# Patient Record
Sex: Female | Born: 1965 | Hispanic: No | Marital: Married | Smoking: Never smoker
Health system: Southern US, Community
[De-identification: ages and names within clinical notes are randomized; demographics above are authoritative.]

## PROBLEM LIST (undated history)

## (undated) DIAGNOSIS — M459 Ankylosing spondylitis of unspecified sites in spine: Secondary | ICD-10-CM

## (undated) DIAGNOSIS — M8580 Other specified disorders of bone density and structure, unspecified site: Secondary | ICD-10-CM

## (undated) DIAGNOSIS — I4891 Unspecified atrial fibrillation: Secondary | ICD-10-CM

## (undated) HISTORY — DX: Unspecified atrial fibrillation: I48.91

## (undated) HISTORY — DX: Other specified disorders of bone density and structure, unspecified site: M85.80

## (undated) HISTORY — PX: ABDOMINAL HYSTERECTOMY: SHX81

## (undated) HISTORY — PX: TOTAL ABDOMINAL HYSTERECTOMY: SHX209

## (undated) HISTORY — DX: Ankylosing spondylitis of unspecified sites in spine: M45.9

---

## 2016-02-25 LAB — HM COLONOSCOPY

## 2021-04-09 ENCOUNTER — Other Ambulatory Visit: Payer: Self-pay

## 2021-04-12 ENCOUNTER — Other Ambulatory Visit: Payer: Self-pay

## 2021-04-12 ENCOUNTER — Ambulatory Visit (INDEPENDENT_AMBULATORY_CARE_PROVIDER_SITE_OTHER): Payer: 59 | Admitting: Family Medicine

## 2021-04-12 ENCOUNTER — Encounter: Payer: Self-pay | Admitting: Family Medicine

## 2021-04-12 VITALS — BP 120/74 | HR 79 | Temp 97.0°F | Ht 61.75 in | Wt 128.4 lb

## 2021-04-12 DIAGNOSIS — Z1231 Encounter for screening mammogram for malignant neoplasm of breast: Secondary | ICD-10-CM

## 2021-04-12 DIAGNOSIS — M459 Ankylosing spondylitis of unspecified sites in spine: Secondary | ICD-10-CM | POA: Insufficient documentation

## 2021-04-12 DIAGNOSIS — M8580 Other specified disorders of bone density and structure, unspecified site: Secondary | ICD-10-CM | POA: Insufficient documentation

## 2021-04-12 DIAGNOSIS — K219 Gastro-esophageal reflux disease without esophagitis: Secondary | ICD-10-CM | POA: Diagnosis not present

## 2021-04-12 DIAGNOSIS — M457 Ankylosing spondylitis of lumbosacral region: Secondary | ICD-10-CM | POA: Diagnosis not present

## 2021-04-12 DIAGNOSIS — L439 Lichen planus, unspecified: Secondary | ICD-10-CM | POA: Diagnosis not present

## 2021-04-12 DIAGNOSIS — M8588 Other specified disorders of bone density and structure, other site: Secondary | ICD-10-CM

## 2021-04-12 DIAGNOSIS — E875 Hyperkalemia: Secondary | ICD-10-CM

## 2021-04-12 DIAGNOSIS — K5909 Other constipation: Secondary | ICD-10-CM

## 2021-04-12 MED ORDER — ESTRADIOL 0.5 MG PO TABS: 0.5000 mg | ORAL_TABLET | Freq: Every day | ORAL | 3 refills | Status: DC

## 2021-04-12 NOTE — Progress Notes (Signed)
[facility] [address] [place] [address] Dept: [phone] Dept Fax: [phone]  New Patient Office Visit  Subjective:    Patient ID: Emily Woodward, female    DOB: 1965-08-19, 56 y.o..   MRN: [id number]  Chief Complaint  Patient presents with   Establish Care    NP-establish care.   No concerns.        History of Present Illness:  Patient is in today to establish care. Emily Woodward was born in [place]. She moved to the [place] ([place] in 1995. They later moved to [facility]. 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 She has been married for 31 years. She has a daughter (46) who is in her residency in [place]. She denies tobacco or drug use. She drinks 2-3 glasses of wine per week.  Emily Woodward has a history of ankylosing spondylitis. She is managing this with yoga, stretching, regular physical activity and meloxicam when she has flares.  She has a strong family history of AS (mother, brother, and sister).  Emily Woodward has a history of lichen planus. She managed this with an oral lidocaine solution for lesions in the mouth.  Emily Woodward has a history of osteopenia. She has had two prior fractures, though these were due to traumas and do not sound osteoporotic. She had her last DXA scan in 2018. It showed T-scores between -1.4 to -2.2. She was treated int he past with alendronate. She then was on estradiol for a period of time. She has now been on risedronate. She notes her daughter has asked her about going back on an estregen, as she does still experience hot flashes and she has had a prior hysterectomy.  Emily Woodward has a history of chronic constipation. She is managed with plecanatide (Trulance). She was tried on other preparations (including fiber, Linzess and Amitiza), but has been able to tolerate the Trulance better.  Emily Woodward has a history of GERD, well managed on Protonix.  Emily Woodward notes a history of mildly  elevated potassium levels. She notes she was previously evaluated, but without an underlying cause having been found. She avoids potassium-containing supplements.  Past Medical History: Patient Active Problem List   Diagnosis Date Noted   Ankylosing spondylitis (HCC) 04/12/2021   Osteopenia 04/12/2021   Lichen planus 04/12/2021   GERD (gastroesophageal reflux disease) 04/12/2021   Hyperkalemia 04/12/2021   Past Surgical History:  Procedure Laterality Date   ABDOMINAL HYSTERECTOMY     Family History  Problem Relation Age of Onset   Arthritis Mother    Hyperlipidemia Mother    Osteoporosis Mother    Ankylosing spondylitis Father    Diabetes Father    Ankylosing spondylitis Sister    Cancer Sister        ovarian   Ankylosing spondylitis Brother    Outpatient Medications Prior to Visit  Medication Sig Dispense Refill   calcium carbonate (OS-CAL - DOSED IN MG OF ELEMENTAL CALCIUM) 1250 (500 Ca) MG tablet Take 1 tablet by mouth.     lidocaine (XYLOCAINE) 2 % solution SMARTSIG:15 Milliliter(s) By Mouth Every 3 Hours PRN     meloxicam (MOBIC) 15 MG tablet Take 15 mg by mouth daily.     Multiple Vitamin (MULTIVITAMIN) tablet Take 1 tablet by mouth daily.     pantoprazole (PROTONIX) 40 MG tablet Take 40 mg by mouth daily.     risedronate (ACTONEL) 150 MG tablet Take 150 mg by mouth every 30 (thirty) days.  TRULANCE 3 MG TABS Take 1 tablet by mouth daily.     No facility-administered medications prior to visit.   No Known Allergies    Objective:   Today's Vitals   04/12/21 1314  BP: 120/74  Pulse: 79  Temp: (!) 97 F (36.1 C)  TempSrc: Temporal  SpO2: 98%  Weight: 128 lb 6.4 oz (58.2 kg)  Height: 5' 1.75" (1.568 m)   Body mass index is 23.68 kg/m.   General: Well developed, well nourished. No acute distress. Psych: Alert and oriented. Normal mood and affect.  Health Maintenance Due  Topic Date Due   HIV Screening  Never done   Hepatitis C Screening  Never done    TETANUS/TDAP  Never done   PAP SMEAR-Modifier  Never done   COLONOSCOPY (Pts 45-28yrs Insurance coverage will need to be confirmed)  Never done   MAMMOGRAM  Never done   Zoster Vaccines- Shingrix (1 of 2) Never done    Assessment & Plan:   1. Ankylosing spondylitis of lumbosacral region (HCC) Stable on meloxicam and with stretches/yoga. No extra-articular effects noted.  2. Osteopenia of lumbar spine I recommend we repeat her DXA scan to see if there is any progressive bone loss. I wills tart her back on estradiol, but will hold off on discontinuing the risedronate until after we see the bone density results.  - DG Bone Density; Future - estradiol (ESTRACE) 0.5 MG tablet; Take 1 tablet (0.5 mg total) by mouth daily.  Dispense: 90 tablet; Refill: 3  3. Lichen planus Stable with PRN topical lidocaine for oral lesions.  4. Gastroesophageal reflux disease without esophagitis Stable on Protonix.  5. Hyperkalemia I reviewed labs from Emily Woodward's previous provider in [place]. We will monitor this for now.  6. Encounter for screening mammogram for malignant neoplasm of breast  - MM DIGITAL SCREENING BILATERAL; Future  Emily Mast, MD

## 2021-04-13 ENCOUNTER — Telehealth: Payer: Self-pay

## 2021-04-13 NOTE — Telephone Encounter (Signed)
PR for Trulance 3 mg tabs submitted through cover my meds to [employer]. Awaiting on response. Dm/cma     Key: [id number]

## 2021-04-13 NOTE — Telephone Encounter (Signed)
PA denied. Dm/cma

## 2021-04-20 ENCOUNTER — Telehealth: Payer: Self-pay

## 2021-04-20 NOTE — Telephone Encounter (Signed)
PA submitted to [employer] through cover my meds for the Trulance 3 mg tabs.  Awaiting response.   Dm/cma  Key: [id number]

## 2021-04-21 NOTE — Telephone Encounter (Signed)
PA denied again due to not a covered benefit and excluded from coverage in accordance with the terms and conditions of you plan benefit. Dm/cma

## 2021-05-06 ENCOUNTER — Telehealth: Payer: Self-pay | Admitting: Family Medicine

## 2021-05-06 NOTE — Telephone Encounter (Signed)
Pt called because she received a bill for over $300 from her new pt appt with Dr Gena Fray and she thought maybe her insurance wasn't billed but I see it shows on the appt. I gave her the billing number so maybe they can address but just in case she calls back I wanted to go ahead and make you aware ?

## 2021-05-10 NOTE — Telephone Encounter (Signed)
Pt called billing and charges were explained on 05/07/21 ?

## 2021-05-11 ENCOUNTER — Ambulatory Visit
Admission: RE | Admit: 2021-05-11 | Discharge: 2021-05-11 | Disposition: A | Discharge status: Home / Self Care | Payer: 59 | Source: Ambulatory Visit | Attending: Family Medicine | Admitting: Family Medicine

## 2021-05-11 DIAGNOSIS — Z1231 Encounter for screening mammogram for malignant neoplasm of breast: Secondary | ICD-10-CM

## 2021-07-28 ENCOUNTER — Ambulatory Visit (INDEPENDENT_AMBULATORY_CARE_PROVIDER_SITE_OTHER): Payer: 59 | Admitting: Family Medicine

## 2021-07-28 VITALS — BP 118/66 | HR 71 | Temp 97.8°F | Ht 61.7 in | Wt 129.8 lb

## 2021-07-28 DIAGNOSIS — M6530 Trigger finger, unspecified finger: Secondary | ICD-10-CM | POA: Insufficient documentation

## 2021-07-28 DIAGNOSIS — N3001 Acute cystitis with hematuria: Secondary | ICD-10-CM

## 2021-07-28 DIAGNOSIS — S62009A Unspecified fracture of navicular [scaphoid] bone of unspecified wrist, initial encounter for closed fracture: Secondary | ICD-10-CM | POA: Insufficient documentation

## 2021-07-28 LAB — POCT URINALYSIS DIPSTICK
Bilirubin, UA: NEGATIVE
Glucose, UA: NEGATIVE
Ketones, UA: NEGATIVE
Nitrite, UA: NEGATIVE
Protein, UA: NEGATIVE
Spec Grav, UA: 1.01 (ref 1.010–1.025)
Urobilinogen, UA: 0.2 E.U./dL
pH, UA: 6 (ref 5.0–8.0)

## 2021-07-28 MED ORDER — PHENAZOPYRIDINE HCL 100 MG PO TABS: 100.0000 mg | ORAL_TABLET | Freq: Three times a day (TID) | ORAL | 0 refills | Status: DC | PRN

## 2021-07-28 MED ORDER — SULFAMETHOXAZOLE-TRIMETHOPRIM 800-160 MG PO TABS: 1.0000 | ORAL_TABLET | Freq: Two times a day (BID) | ORAL | 0 refills | Status: DC

## 2021-07-28 NOTE — Progress Notes (Signed)
[facility] [address] [place] [address] Dept: [phone] Dept Fax: [phone]  Office Visit  Subjective:    Patient ID: Emily Woodward, female    DOB: 1965-12-26, 56 y.o..   MRN: [id number]  Chief Complaint  Patient presents with   Acute Visit    C/o having urine frequency/burning x 1 month.      History of Present Illness:  Patient is in today for assessment of a possibel UTI. Emily Woodward notes she has had some symptoms over the past month. She had traveled back to [place]. While there she was treated with a 5-day course of Cipro. She had some residual symptoms after the antibiotics that she thought might be a yeast infection, so she took a 3-day course of Diflucan. She is now noting ongoing nocturia, frequency, and dysuria.  Past Medical History: Patient Active Problem List   Diagnosis Date Noted   Fracture of scaphoid bone of wrist 07/28/2021   Acquired trigger finger 07/28/2021   Ankylosing spondylitis ([place]) 04/12/2021   Osteopenia 0000000   Lichen planus 0000000   GERD (gastroesophageal reflux disease) 04/12/2021   Hyperkalemia 04/12/2021   Chronic constipation 04/12/2021   Past Surgical History:  Procedure Laterality Date   ABDOMINAL HYSTERECTOMY     Family History  Problem Relation Age of Onset   Arthritis Mother    Hyperlipidemia Mother    Osteoporosis Mother    Ankylosing spondylitis Father    Diabetes Father    Ankylosing spondylitis Sister    Cancer Sister        ovarian   Ankylosing spondylitis Brother    Outpatient Medications Prior to Visit  Medication Sig Dispense Refill   calcium carbonate (OS-CAL - DOSED IN MG OF ELEMENTAL CALCIUM) 1250 (500 Ca) MG tablet Take 1 tablet by mouth.     estradiol (ESTRACE) 0.5 MG tablet Take 1 tablet (0.5 mg total) by mouth daily. 90 tablet 3   lidocaine (XYLOCAINE) 2 % solution SMARTSIG:15 Milliliter(s) By Mouth Every 3 Hours PRN     meloxicam  (MOBIC) 15 MG tablet Take 15 mg by mouth daily.     Multiple Vitamin (MULTIVITAMIN) tablet Take 1 tablet by mouth daily.     pantoprazole (PROTONIX) 40 MG tablet Take 40 mg by mouth daily.     polyethylene glycol (MIRALAX / GLYCOLAX) 17 g packet Take 17 g by mouth daily.     risedronate (ACTONEL) 150 MG tablet Take 150 mg by mouth every 30 (thirty) days.     TRULANCE 3 MG TABS Take 1 tablet by mouth daily. (Patient not taking: Reported on 07/28/2021)     No facility-administered medications prior to visit.   No Known Allergies    Objective:   Today's Vitals   07/28/21 0809  BP: 118/66  Pulse: 71  Temp: 97.8 F (36.6 C)  TempSrc: Temporal  SpO2: 99%  Weight: 129 lb 12.8 oz (58.9 kg)  Height: 5' 1.7" (1.567 m)   Body mass index is 23.97 kg/m.   General: Well developed, well nourished. No acute distress. Psych: Alert and oriented. Normal mood and affect.  Health Maintenance Due  Topic Date Due   HIV Screening  Never done   Hepatitis C Screening  Never done   Lab Results Urine dipstick shows negative for nitrites, glucose, ketones, urobilinogen, positive for leukocytes, red blood cells, protein.  Assessment & Plan:   1. Acute cystitis with hematuria It appears Emily Woodward continues to have a UTI. I  will treat her empirically with Septra. I will send the urine for culture and sensitivity. I will provide Pyridium for discomfort.  - POCT Urinalysis Dipstick - Urine Culture - sulfamethoxazole-trimethoprim (BACTRIM DS) 800-160 MG tablet; Take 1 tablet by mouth 2 (two) times daily.  Dispense: 14 tablet; Refill: 0 - phenazopyridine (PYRIDIUM) 100 MG tablet; Take 1 tablet (100 mg total) by mouth 3 (three) times daily as needed for pain.  Dispense: 6 tablet; Refill: 0   Return if symptoms worsen or fail to improve.   Haydee Salter, MD

## 2021-07-29 ENCOUNTER — Other Ambulatory Visit: Payer: Self-pay

## 2021-07-29 NOTE — Telephone Encounter (Signed)
Refill request for  Risendronate Sodium 15 mg LR   hx provider LOV 07/28/21 FOV  04/14/22  Please review and advise.  Thanks.  Dm/cma

## 2021-07-30 MED ORDER — RISEDRONATE SODIUM 150 MG PO TABS: 150.0000 mg | ORAL_TABLET | ORAL | 3 refills | Status: DC

## 2021-07-31 LAB — URINE CULTURE
MICRO NUMBER:: 13445363
SPECIMEN QUALITY:: ADEQUATE

## 2021-08-23 ENCOUNTER — Ambulatory Visit: Payer: 59 | Admitting: Family Medicine

## 2021-08-23 VITALS — BP 122/70 | HR 71 | Temp 97.6°F | Ht 61.5 in | Wt 129.5 lb

## 2021-08-23 DIAGNOSIS — R300 Dysuria: Secondary | ICD-10-CM | POA: Diagnosis not present

## 2021-08-23 DIAGNOSIS — Z8619 Personal history of other infectious and parasitic diseases: Secondary | ICD-10-CM | POA: Diagnosis not present

## 2021-08-23 LAB — POCT URINALYSIS DIPSTICK
Bilirubin, UA: NEGATIVE
Blood, UA: NEGATIVE
Glucose, UA: NEGATIVE
Ketones, UA: NEGATIVE
Leukocytes, UA: NEGATIVE
Nitrite, UA: NEGATIVE
Protein, UA: NEGATIVE
Spec Grav, UA: 1.015 (ref 1.010–1.025)
Urobilinogen, UA: 0.2 E.U./dL
pH, UA: 6 (ref 5.0–8.0)

## 2021-08-23 NOTE — Progress Notes (Signed)
[facility] [address] [place] [address] Dept: [phone] Dept Fax: [phone]  Office Visit  Subjective:    Patient ID: Emily Woodward, female    DOB: [birth year], 56 y.o..   MRN: [id number]  Chief Complaint  Patient presents with   Acute Visit    Co having dysuria x 4 days.      History of Present Illness:  Patient is in today with a 4-day history of dysuria. I saw Emily Woodward 3 weeks ago, noting she had UTI symptoms over the prior month. She had traveled back to [place]. While there, she was treated with a 5-day course of Cipro. She had some residual symptoms after the antibiotics that she thought might be a yeast infection, so she took a 3-day course of Diflucan. She then had ongoing nocturia, frequency, and dysuria. I treated her with a 7-day course of Septra. She notes that her symptoms did improve and resolve, through it was a few days after she stopped the antibiotic before they fully resolved. She is now having a return of dysuria. She is postmenopausal. She is currently on estradiol. She denies use of any feminine hygiene products. She is now taking a daily cranberry extract capsule.  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 Escherichia coli 08/23/2021   Fracture of scaphoid bone of wrist 07/28/2021   Acquired trigger finger 07/28/2021   Ankylosing spondylitis (HCC) 04/12/2021   Osteopenia 04/12/2021   Lichen planus 04/12/2021   GERD (gastroesophageal reflux disease) 04/12/2021   Hyperkalemia 04/12/2021   Chronic constipation 04/12/2021   Past Surgical History:  Procedure Laterality Date   ABDOMINAL HYSTERECTOMY     Family History  Problem Relation Age of Onset   Arthritis Mother    Hyperlipidemia Mother    Osteoporosis Mother    Ankylosing spondylitis Father    Diabetes Father    Ankylosing spondylitis Sister    Cancer Sister         ovarian   Ankylosing spondylitis Brother    Outpatient Medications Prior to Visit  Medication Sig Dispense Refill   calcium carbonate (OS-CAL - DOSED IN MG OF ELEMENTAL CALCIUM) 1250 (500 Ca) MG tablet Take 1 tablet by mouth.     estradiol (ESTRACE) 0.5 MG tablet Take 1 tablet (0.5 mg total) by mouth daily. 90 tablet 3   lidocaine (XYLOCAINE) 2 % solution SMARTSIG:15 Milliliter(s) By Mouth Every 3 Hours PRN     meloxicam (MOBIC) 15 MG tablet Take 15 mg by mouth daily.     Multiple Vitamin (MULTIVITAMIN) tablet Take 1 tablet by mouth daily.     pantoprazole (PROTONIX) 40 MG tablet Take 40 mg by mouth daily.     polyethylene glycol (MIRALAX / GLYCOLAX) 17 g packet Take 17 g by mouth daily.     risedronate (ACTONEL) 150 MG tablet Take 1 tablet (150 mg total) by mouth every 30 (thirty) days. 3 tablet 3   TRULANCE 3 MG TABS Take 1 tablet by mouth daily.     phenazopyridine (PYRIDIUM) 100 MG tablet Take 1 tablet (100 mg total) by mouth 3 (three) times daily as needed for pain. 6 tablet 0   sulfamethoxazole-trimethoprim (BACTRIM DS) 800-160 MG tablet Take 1 tablet by mouth 2 (two) times daily. 14 tablet 0   No facility-administered medications prior to visit.   No Known Allergies    Objective:   Today's Vitals   08/23/21  1024  BP: 122/70  Pulse: 71  Temp: 97.6 F (36.4 C)  TempSrc: Temporal  SpO2: 98%  Weight: 129 lb 8 oz (58.7 kg)  Height: 5' 1.5" (1.562 m)   Body mass index is 24.07 kg/m.   General: Well developed, well nourished. No acute distress. Psych: Alert and oriented. Normal mood and affect.  Health Maintenance Due  Topic Date Due   HIV Screening  Never done   Hepatitis C Screening  Never done    Lab Results Component 3 wk ago  MICRO NUMBER: [id number]   SPECIMEN QUALITY: Adequate   Sample Source NOT GIVEN   STATUS: FINAL   ISOLATE 1: ESBL Escherichia coli Abnormal    Comment: 10,000-49,000 CFU/mL of Escherichia coli (ESBL) ESBL RESULT:        The organism has  been confirmed as an ESBL producer.  Resulting Agency [facility] [place]     Susceptibility   Esbl escherichia coli    URINE CULTURE, REFLEX    AMOX/CLAVULANIC >=32  Resistant    AMPICILLIN >=32  Resistant 1    AMPICILLIN/SULBACTAM >=32  Resistant    CEFAZOLIN >=64  Resistant 2    CEFEPIME 2  Sensitive    CEFTAZIDIME 16  Resistant    CEFTRIAXONE >=64  Resistant    CIPROFLOXACIN >=4  Resistant    GENTAMICIN >=16  Resistant    IMIPENEM <=0.25  Sensitive    LEVOFLOXACIN >=8  Resistant    NITROFURANTOIN <=16  Sensitive    PIP/TAZO 8  Sensitive    TOBRAMYCIN >=16  Resistant    TRIMETH/SULFA <=20  Sensitive 3           1 Extended spectrum beta-lactamase (ESBL) producing  organisms demonstrate decreased activity with  penicillins, cephalosporins and aztreonam.   2 For uncomplicated UTI caused by E. coli,  K. pneumoniae or P. mirabilis: Cefazolin is  susceptible if MIC <32 mcg/mL and predicts  susceptible to the oral agents cefaclor, cefdinir,  cefpodoxime, cefprozil, cefuroxime, cephalexin  and loracarbef.           Component Ref Range & Units 10:29 3 wk ago  Color, UA  light yellow  yellow   Clarity, UA  clear  cloudy   Glucose, UA Negative Negative  Negative   Bilirubin, UA  neg  neg   Ketones, UA  neg  neg   Spec Grav, UA 1.010 - 1.025 1.015  1.010   Blood, UA  neg  1+   pH, UA 5.0 - 8.0 6.0  6.0   Protein, UA Negative Negative  Negative CM   Urobilinogen, UA 0.2 or 1.0 E.U./dL 0.2  0.2   Nitrite, UA  neg  neg   Leukocytes, UA Negative Negative  Large (3+) Abnormal        Assessment & Plan:   1. Dysuria Emily Woodward's urine dipstick results today are clear. We did discuss other possible causes of dysuria other than infection. In light of her recent ESBL E. coli infection, I will go ahead and check a urine culture to be sure this did clear.  - POCT Urinalysis Dipstick - Urine Culture - Urinalysis w microscopic + reflex cultur  2. History of infection  due to ESBL Escherichia coli As above.  - Urine Culture - Urinalysis w microscopic + reflex cultur   Return if symptoms worsen or fail to improve.   Loyola Mast, MD

## 2021-08-24 LAB — URINALYSIS, COMPLETE
Bacteria, UA: NONE SEEN /HPF
Bilirubin Urine: NEGATIVE
Glucose, UA: NEGATIVE
Hgb urine dipstick: NEGATIVE
Hyaline Cast: NONE SEEN /LPF
Ketones, ur: NEGATIVE
Leukocytes,Ua: NEGATIVE
Nitrite: NEGATIVE
Protein, ur: NEGATIVE
RBC / HPF: NONE SEEN /HPF (ref 0–2)
Specific Gravity, Urine: 1.006 (ref 1.001–1.035)
Squamous Epithelial / HPF: NONE SEEN /HPF (ref <=5)
WBC, UA: NONE SEEN /HPF (ref 0–5)
pH: 5.5 (ref 5.0–8.0)

## 2021-08-24 LAB — URINE CULTURE
MICRO NUMBER:: 13542514
Result:: NO GROWTH
SPECIMEN QUALITY:: ADEQUATE

## 2021-09-29 ENCOUNTER — Ambulatory Visit
Admission: RE | Admit: 2021-09-29 | Discharge: 2021-09-29 | Disposition: A | Discharge status: Home / Self Care | Payer: 59 | Source: Ambulatory Visit | Attending: Family Medicine | Admitting: Family Medicine

## 2021-09-29 DIAGNOSIS — M8588 Other specified disorders of bone density and structure, other site: Secondary | ICD-10-CM

## 2021-11-16 ENCOUNTER — Ambulatory Visit (INDEPENDENT_AMBULATORY_CARE_PROVIDER_SITE_OTHER): Payer: 59 | Admitting: Family Medicine

## 2021-11-16 ENCOUNTER — Encounter: Payer: Self-pay | Admitting: Family Medicine

## 2021-11-16 VITALS — BP 116/78 | HR 66 | Temp 97.5°F | Ht 61.5 in | Wt 129.0 lb

## 2021-11-16 DIAGNOSIS — M8589 Other specified disorders of bone density and structure, multiple sites: Secondary | ICD-10-CM

## 2021-11-16 DIAGNOSIS — N3090 Cystitis, unspecified without hematuria: Secondary | ICD-10-CM | POA: Diagnosis not present

## 2021-11-16 NOTE — Progress Notes (Unsigned)
[facility] [address] [place] [address] Dept: [phone] Dept Fax: [phone]  Office Visit  Subjective:    Patient ID: Emily Woodward, female    DOB: Sep 11, 1965, 56 y.o..   MRN: [id number]  Chief Complaint  Patient presents with   Acute Visit    C/o having burning and frequency with urination x 2-3 days.      History of Present Illness:  Patient is in today with a three day history of dysuria and frequency. Emily Woodward has had bladder infections twice since May. One involved an ESBL E. coli, but this was contracted after a trip to [place]. She had been taking a cranberry extract, but is no longer doing this. However, Emily Woodward has a concern about her recurrent infections.  Emily Woodward has a history of osteopenia and a wrist bone fracture. She was started on risendronate about 2-3 years ago. She had a recent Dexa scan.  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 Escherichia coli 08/23/2021   Fracture of scaphoid bone of wrist 07/28/2021   Acquired trigger finger 07/28/2021   Ankylosing spondylitis (HCC) 04/12/2021   Osteopenia 04/12/2021   Lichen planus 04/12/2021   GERD (gastroesophageal reflux disease) 04/12/2021   Hyperkalemia 04/12/2021   Chronic constipation 04/12/2021   Past Surgical History:  Procedure Laterality Date   ABDOMINAL HYSTERECTOMY     Family History  Problem Relation Age of Onset   Arthritis Mother    Hyperlipidemia Mother    Osteoporosis Mother    Ankylosing spondylitis Father    Diabetes Father    Ankylosing spondylitis Sister    Cancer Sister        ovarian   Ankylosing spondylitis Brother    Outpatient Medications Prior to Visit  Medication Sig Dispense Refill   calcium carbonate (OS-CAL - DOSED IN MG OF ELEMENTAL CALCIUM) 1250 (500 Ca) MG tablet Take 1 tablet by mouth.     estradiol (ESTRACE) 0.5 MG tablet Take 1  tablet (0.5 mg total) by mouth daily. 90 tablet 3   lidocaine (XYLOCAINE) 2 % solution SMARTSIG:15 Milliliter(s) By Mouth Every 3 Hours PRN     meloxicam (MOBIC) 15 MG tablet Take 15 mg by mouth daily.     Multiple Vitamin (MULTIVITAMIN) tablet Take 1 tablet by mouth daily.     pantoprazole (PROTONIX) 40 MG tablet Take 40 mg by mouth daily.     polyethylene glycol (MIRALAX / GLYCOLAX) 17 g packet Take 17 g by mouth daily.     risedronate (ACTONEL) 150 MG tablet Take 1 tablet (150 mg total) by mouth every 30 (thirty) days. 3 tablet 3   No facility-administered medications prior to visit.   No Known Allergies    Objective:   Today's Vitals   11/16/21 1602  BP: 116/78  Pulse: 66  Temp: (!) 97.5 F (36.4 C)  TempSrc: Temporal  SpO2: 99%  Weight: 129 lb (58.5 kg)  Height: 5' 1.5" (1.562 m)   Body mass index is 23.98 kg/m.   General: Well developed, well nourished. No acute distress. Psych: Alert and oriented. Normal mood and affect.  Health Maintenance Due  Topic Date Due   HIV Screening  Never done   Hepatitis C Screening  Never done   Lab Results Urine dipstick shows negative for nitrites, red blood cells, glucose, protein, ketones, urobilinogen, positive for leukocytes.    Imaging: Bone Density (09/29/2021) ASSESSMENT:  The BMD measured at Femur Neck Left is 0.759 g/cm2 with a T-score of -2.0. This patient is considered osteopenic/low bone mass according to World Health Organization [facility]) criteria.   The quality of the exam is good. The patient does not meet the FRAX criteria.   Site Region Measured Date Measured Age YA BMD Significant CHANGE T-score DualFemur Neck Left 09/29/2021 56.1 -2.0 0.759 g/cm2   AP Spine L1-L4 09/29/2021 56.1 -1.6 0.984 g/cm2   DualFemur Total Mean 09/29/2021 56.1 -1.4 0.834 g/cm2   Left Forearm Radius 33% 09/29/2021 56.1 -0.6 0.825 g/cm2  Assessment & Plan:   1. Recurrent cystitis The urine shows 3+ leukocytes. I offered empiric  antibiotics, but she prefers to await the result of her urine culture, in light of her past resistant infeciton. I recommended she push fluids int he meantime. In light of her having three UTIs in the past 4 months, I will refer her to urology for an assessment.  - POCT Urinalysis Dipstick - Urine Culture - Ambulatory referral to Urology  2. Osteopenia of multiple sites The bone density is consistent with osteopenia of the lumbar spine and femur. As she was previously started on risedronate, I will continue this for now. I recommend we treat her for 5 years and then stop this medicine.  Return if symptoms worsen or fail to improve.   Loyola Mast, MD

## 2021-11-17 LAB — POCT URINALYSIS DIPSTICK
Bilirubin, UA: NEGATIVE
Blood, UA: NEGATIVE
Glucose, UA: NEGATIVE
Ketones, UA: NEGATIVE
Nitrite, UA: NEGATIVE
Protein, UA: NEGATIVE
Spec Grav, UA: 1.01 (ref 1.010–1.025)
Urobilinogen, UA: 0.2 E.U./dL
pH, UA: 6 (ref 5.0–8.0)

## 2021-11-19 ENCOUNTER — Other Ambulatory Visit: Payer: Self-pay | Admitting: Family Medicine

## 2021-11-19 DIAGNOSIS — N390 Urinary tract infection, site not specified: Secondary | ICD-10-CM

## 2021-11-19 LAB — URINE CULTURE
MICRO NUMBER:: 13905611
SPECIMEN QUALITY:: ADEQUATE

## 2021-11-19 MED ORDER — NITROFURANTOIN MONOHYD MACRO 100 MG PO CAPS
100.0000 mg | ORAL_CAPSULE | Freq: Two times a day (BID) | ORAL | 0 refills | Status: AC
Start: 2021-11-19 — End: 2021-11-26

## 2021-11-22 ENCOUNTER — Encounter: Payer: Self-pay | Admitting: Family Medicine

## 2022-02-24 ENCOUNTER — Telehealth: Payer: Self-pay | Admitting: Family Medicine

## 2022-02-24 NOTE — Telephone Encounter (Signed)
Pt called today @ 11:45 returning your call. Please pt another call

## 2022-02-24 NOTE — Telephone Encounter (Signed)
Pt stated she would like to get A1C and fasting . Pt stated her sugar was up. What should she do

## 2022-02-24 NOTE — Telephone Encounter (Signed)
Spoke to patient and she states that her fasting sugar has been around 120 and a non-fasting (2 hrs) 200.  She had eaten some rice.  She has an appointment in February, does she need an appointment before then?  Please and thank you.  Dm/cma

## 2022-02-24 NOTE — Telephone Encounter (Signed)
Left VM to rtn call. Dm/cma       

## 2022-03-01 NOTE — Telephone Encounter (Signed)
Spoke to patient, she is scheduled for 03/09/22 @ 8:20 am. Dm/cma

## 2022-03-09 ENCOUNTER — Ambulatory Visit (INDEPENDENT_AMBULATORY_CARE_PROVIDER_SITE_OTHER): Payer: 59 | Admitting: Family Medicine

## 2022-03-09 ENCOUNTER — Encounter: Payer: Self-pay | Admitting: Family Medicine

## 2022-03-09 VITALS — BP 116/74 | HR 71 | Temp 97.2°F | Ht 61.5 in | Wt 125.4 lb

## 2022-03-09 DIAGNOSIS — Z8639 Personal history of other endocrine, nutritional and metabolic disease: Secondary | ICD-10-CM

## 2022-03-09 DIAGNOSIS — R739 Hyperglycemia, unspecified: Secondary | ICD-10-CM | POA: Diagnosis not present

## 2022-03-09 DIAGNOSIS — K219 Gastro-esophageal reflux disease without esophagitis: Secondary | ICD-10-CM

## 2022-03-09 DIAGNOSIS — E875 Hyperkalemia: Secondary | ICD-10-CM | POA: Diagnosis not present

## 2022-03-09 DIAGNOSIS — R0609 Other forms of dyspnea: Secondary | ICD-10-CM

## 2022-03-09 LAB — BASIC METABOLIC PANEL
BUN: 11 mg/dL (ref 6–23)
CO2: 27 mEq/L (ref 19–32)
Calcium: 9.3 mg/dL (ref 8.4–10.5)
Chloride: 103 mEq/L (ref 96–112)
Creatinine, Ser: 0.6 mg/dL (ref 0.40–1.20)
GFR: 100.2 mL/min (ref >60.00)
Glucose, Bld: 100 mg/dL — ABNORMAL HIGH (ref 70–99)
Potassium: 5.5 mEq/L — ABNORMAL HIGH (ref 3.5–5.1)
Sodium: 138 mEq/L (ref 135–145)

## 2022-03-09 LAB — VITAMIN D 25 HYDROXY (VIT D DEFICIENCY, FRACTURES): VITD: 57.49 ng/mL (ref 30.00–100.00)

## 2022-03-09 LAB — HEMOGLOBIN A1C: Hgb A1c MFr Bld: 5.8 % (ref 4.6–6.5)

## 2022-03-09 NOTE — Progress Notes (Signed)
[facility] [address] [place] [address] Dept: [phone] Dept Fax: [phone]  Office Visit  Subjective:    Patient ID: Drake Leach, female    DOB: Jun 30, 1965, 57 y.o..   MRN: [id number]  Chief Complaint  Patient presents with   Follow-up    F/u blood sugar being elevated- fasting 120-200 at home.      History of Present Illness:  Patient is in today for several issues.  Ms. Buege notes she has been monitoring her blood sugars at home. She has a strong family history of diabetes, so feels it important that she screen for this. She has recently seen a fasting blood sugar > 120. She notes she also had a recent occasion where her blood sugar was 200 after she ate rice.  Ms. Gagliano has a history of mildly elevated potassium levels. She notes she was previously evaluated, but without an underlying cause having been found. She avoids potassium-containing supplements. She would like to have this assessed.  Ms. Shearon reports a past history of Vit D deficiency/insufficiency. She takes a daily supplement. She requests a follow-up on this.  Ms. Gombos has a past history of GERD. She notes that she is having episodic issues with a burning sensation in her epigastrium. She can also feel some bloating. She has not been able to determine any specific trigger, except possibly garlic at times. This used to occur about every 6 months. More recently, this has been occurring about every 2 months. She has tried using Protonix, Pepcid Ac, and various antacids without much relief in the short-term.  Ms. Vogt notes she climbed [place] in Oct. She states that on multiple occasions, she found her heart rate to be very rapid and that she felt dyspneic. She feels she had less tolerance for the climbing than others in her group. She describes some occasion episodes at other times that she exerts herself. She asks about having an EKG to  check on her heart.  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 Escherichia coli 08/23/2021   Fracture of scaphoid bone of wrist 07/28/2021   Acquired trigger finger 07/28/2021   Ankylosing spondylitis ([place]) 04/12/2021   Osteopenia 33/00/7622   Lichen planus 63/33/5456   GERD (gastroesophageal reflux disease) 04/12/2021   Hyperkalemia 04/12/2021   Chronic constipation 04/12/2021   Past Surgical History:  Procedure Laterality Date   ABDOMINAL HYSTERECTOMY     Family History  Problem Relation Age of Onset   Arthritis Mother    Hyperlipidemia Mother    Osteoporosis Mother    Ankylosing spondylitis Father    Diabetes Father    Ankylosing spondylitis Sister    Cancer Sister        ovarian   Ankylosing spondylitis Brother    Outpatient Medications Prior to Visit  Medication Sig Dispense Refill   calcium carbonate (OS-CAL - DOSED IN MG OF ELEMENTAL CALCIUM) 1250 (500 Ca) MG tablet Take 1 tablet by mouth.     estradiol (ESTRACE) 0.5 MG tablet Take 1 tablet (0.5 mg total) by mouth daily. 90 tablet 3   lidocaine (XYLOCAINE) 2 % solution SMARTSIG:15 Milliliter(s) By Mouth Every 3 Hours PRN     meloxicam (MOBIC) 15 MG tablet Take 15 mg by mouth daily.     Multiple Vitamin (MULTIVITAMIN) tablet Take 1 tablet by mouth daily.     pantoprazole (PROTONIX) 40 MG tablet Take 40  mg by mouth daily.     polyethylene glycol (MIRALAX / GLYCOLAX) 17 g packet Take 17 g by mouth daily.     risedronate (ACTONEL) 150 MG tablet Take 1 tablet (150 mg total) by mouth every 30 (thirty) days. 3 tablet 3   No facility-administered medications prior to visit.   No Known Allergies    Objective:   Today's Vitals   03/09/22 0841  BP: 116/74  Pulse: 71  Temp: (!) 97.2 F (36.2 C)  TempSrc: Temporal  SpO2: 95%  Weight: 125 lb 6.4 oz (56.9 kg)  Height: 5' 1.5" (1.562 m)   Body mass index is 23.31 kg/m.   General: Well developed,  well nourished. No acute distress. Psych: Alert and oriented. Normal mood and affect.  Health Maintenance Due  Topic Date Due   HIV Screening  Never done   Hepatitis C Screening  Never done     Assessment & Plan:   1. Hyperglycemia In light of the hyperglycemia on home testing, I will check a fasting glucose and A1c today.  - Basic metabolic panel - Hemoglobin A1c  2. Hyperkalemia We will reassess her potassium level today to see if this remains high.  3. History of vitamin D deficiency I will reassess her Vit D level.  - VITAMIN D 25 Hydroxy (Vit-D Deficiency, Fractures)  4. Gastroesophageal reflux disease without esophagitis We reviewed approaches to episodic GERD symptoms. I do not think she necessarily needs to take daily Protonix. She should consider using a combination of an antacid for more immediate relief and a Pepcid AC  twice a day. If this is not managing her symptoms, or the recur more frequently, we can discuss a GI referral for EGD.  5. Dyspnea on exertion It would not be uncommon for someone climbing  [place] (19,341 ft) to experience tachycardia and dyspnea. There is also the possibility that she could have experienced some altitude sickness at that elevation. I do not feel an EKG would be indicated. We can evaluate her complaint further at her upcoming annual physical.  Return for As scheduled.   Haydee Salter, MD

## 2022-04-05 ENCOUNTER — Ambulatory Visit: Payer: 59 | Admitting: Obstetrics and Gynecology

## 2022-04-14 ENCOUNTER — Encounter: Payer: 59 | Admitting: Family Medicine

## 2022-04-15 ENCOUNTER — Ambulatory Visit (INDEPENDENT_AMBULATORY_CARE_PROVIDER_SITE_OTHER): Payer: 59 | Admitting: Family Medicine

## 2022-04-15 ENCOUNTER — Encounter: Payer: Self-pay | Admitting: Family Medicine

## 2022-04-15 VITALS — BP 114/66 | HR 87 | Temp 97.6°F | Ht 61.5 in | Wt 126.8 lb

## 2022-04-15 DIAGNOSIS — Z0000 Encounter for general adult medical examination without abnormal findings: Secondary | ICD-10-CM

## 2022-04-15 DIAGNOSIS — R0609 Other forms of dyspnea: Secondary | ICD-10-CM | POA: Diagnosis not present

## 2022-04-15 DIAGNOSIS — M25811 Other specified joint disorders, right shoulder: Secondary | ICD-10-CM

## 2022-04-15 DIAGNOSIS — E875 Hyperkalemia: Secondary | ICD-10-CM

## 2022-04-15 DIAGNOSIS — Z1159 Encounter for screening for other viral diseases: Secondary | ICD-10-CM

## 2022-04-15 DIAGNOSIS — Z0001 Encounter for general adult medical examination with abnormal findings: Secondary | ICD-10-CM | POA: Diagnosis not present

## 2022-04-15 DIAGNOSIS — Z13220 Encounter for screening for lipoid disorders: Secondary | ICD-10-CM

## 2022-04-15 DIAGNOSIS — K219 Gastro-esophageal reflux disease without esophagitis: Secondary | ICD-10-CM

## 2022-04-15 DIAGNOSIS — M170 Bilateral primary osteoarthritis of knee: Secondary | ICD-10-CM

## 2022-04-15 DIAGNOSIS — M8588 Other specified disorders of bone density and structure, other site: Secondary | ICD-10-CM

## 2022-04-15 DIAGNOSIS — Z8669 Personal history of other diseases of the nervous system and sense organs: Secondary | ICD-10-CM | POA: Insufficient documentation

## 2022-04-15 MED ORDER — ESTRADIOL 0.5 MG PO TABS: 0.5000 mg | ORAL_TABLET | Freq: Every day | ORAL | 3 refills | Status: DC

## 2022-04-15 MED ORDER — RISEDRONATE SODIUM 150 MG PO TABS: 150.0000 mg | ORAL_TABLET | ORAL | 1 refills | Status: DC

## 2022-04-15 NOTE — Assessment & Plan Note (Signed)
Appears to have some chronic anterior knee pain/chondromalacia patella. I will have orthopedics evaluate this.

## 2022-04-15 NOTE — Assessment & Plan Note (Signed)
I feel a climb on [place]. [place] to have been an effective "stress test". However, as she has ongoign worries, I will refer her to cardiology for an assessment.

## 2022-04-15 NOTE — Assessment & Plan Note (Signed)
Continue on Actonel 150 mg monthly.

## 2022-04-15 NOTE — Assessment & Plan Note (Signed)
I will refer her to orthopedics for an assessment. The restricted motion may represent adhesive capsulitis, as she had this in the other shoulder.

## 2022-04-15 NOTE — Assessment & Plan Note (Signed)
I will recheck the potassium to confirm it is chronically elevated.

## 2022-04-15 NOTE — Progress Notes (Signed)
[facility] [address] [place] [address] Dept: [phone] Dept Fax: [phone]  Annual Physical Visit  Subjective:    Patient ID: Emily Woodward, female    DOB: 01-12-1966, 57 y.o..   MRN: [id number]  Chief Complaint  Patient presents with   Annual Exam    CPE/labs. No fasting today.  C/o heart issues    History of Present Illness:  Patient is in today for an annual physical/preventative visit.  Emily Woodward has a long-standing history of hyperkalemia. She has been previously worked up by a nephrologist. She was advised to avoid high potassium foods.  Review of Systems  Constitutional:  Negative for chills, diaphoresis, fever, malaise/fatigue and weight loss.  HENT:  Negative for congestion, ear pain, hearing loss, sore throat and tinnitus.   Eyes:  Negative for blurred vision, pain, discharge and redness.  Respiratory:  Positive for shortness of breath. Negative for cough, wheezing and stridor.        We had previously discussed issues with DOE. Emily Woodward climbed [place] in Oct. On multiple occasions, she found her heart rate to be very rapid and that she felt dyspneic. She felt she had less tolerance for the climbing than others in her group. She has had occasional episodes at other times that she exerts herself, esp. when walking on slopes.  Cardiovascular:  Negative for chest pain and palpitations.  Gastrointestinal:  Positive for heartburn and nausea. Negative for abdominal pain, constipation, diarrhea and vomiting.       Emily Woodward has a past history of GERD. She notes that she has episodic issues with a burning sensation in her epigastrium. She can also feel some bloating. More recently, this has been occurring about every 2 months. She has tried using Protonix, Pepcid AC, and various antacids without much relief in the short-term. We recently started a PPI to see if this will improve.  Musculoskeletal:   Positive for back pain and joint pain. Negative for myalgias.       History of ankylosing spondylitis. Back typically has done well with use of Mobic. She does note some issues with right shoulder pain and limitation in ROM. She notes a past history of left adhesive capsulitis. She also has had a long-standing history of bilateral anterior knee pain. She notes this as relating to a "floating knee cap".  Skin:  Negative for itching and rash.  Psychiatric/Behavioral:  Negative for depression. The patient is not nervous/anxious.    Past Medical History: Patient Active Problem List   Diagnosis Date Noted   Dyspnea on exertion 04/15/2022   Impingement of right shoulder 04/15/2022   Primary osteoarthritis of both knees 04/15/2022   History of infection due to ESBL Escherichia coli 08/23/2021   Fracture of scaphoid bone of wrist 07/28/2021   Acquired trigger finger 07/28/2021   Ankylosing spondylitis ([place]) 04/12/2021   Osteopenia 0000000   Lichen planus 0000000   GERD (gastroesophageal reflux disease) 04/12/2021   Hyperkalemia 04/12/2021   Chronic constipation 04/12/2021   Past Surgical History:  Procedure Laterality Date   ABDOMINAL HYSTERECTOMY     Family History  Problem Relation Age of Onset   Arthritis Mother    Hyperlipidemia Mother    Osteoporosis Mother    Ankylosing spondylitis Father    Diabetes Father    Ankylosing spondylitis Sister    Cancer Sister        ovarian   Ankylosing spondylitis Brother    Outpatient Medications Prior to  Visit  Medication Sig Dispense Refill   calcium carbonate (OS-CAL - DOSED IN MG OF ELEMENTAL CALCIUM) 1250 (500 Ca) MG tablet Take 1 tablet by mouth.     lidocaine (XYLOCAINE) 2 % solution SMARTSIG:15 Milliliter(s) By Mouth Every 3 Hours PRN     meloxicam (MOBIC) 15 MG tablet Take 15 mg by mouth daily.     Multiple Vitamin (MULTIVITAMIN) tablet Take 1 tablet by mouth daily.     pantoprazole (PROTONIX) 40 MG tablet Take 40 mg by mouth  daily.     polyethylene glycol (MIRALAX / GLYCOLAX) 17 g packet Take 17 g by mouth daily.     estradiol (ESTRACE) 0.5 MG tablet Take 1 tablet (0.5 mg total) by mouth daily. 90 tablet 3   risedronate (ACTONEL) 150 MG tablet Take 1 tablet (150 mg total) by mouth every 30 (thirty) days. 3 tablet 3   No facility-administered medications prior to visit.   No Known Allergies    Objective:   Today's Vitals   04/15/22 0824  BP: 114/66  Pulse: 87  Temp: 97.6 F (36.4 C)  TempSrc: Temporal  SpO2: 99%  Weight: 126 lb 12.8 oz (57.5 kg)  Height: 5' 1.5" (1.562 m)   Body mass index is 23.57 kg/m.   General: Well developed, well nourished. No acute distress. HEENT: Normocephalic, non-traumatic. PERRL, EOMI. Conjunctiva clear. External ears normal. EAC and TMs   normal bilaterally. Nose clear without congestion or rhinorrhea. Mucous membranes moist. Oropharynx clear.   Good dentition. Neck: Supple. No lymphadenopathy. No thyromegaly. Lungs: Clear to auscultation bilaterally. No wheezing, rales or rhonchi. CV: RRR without murmurs or rubs. Pulses 2+ bilaterally. Abdomen: Soft, non-tender. Bowel sounds positive, normal pitch and frequency. No hepatosplenomegaly. No   rebound or guarding. Extremities: Limited ROM of the right shoulder. Pain with rotator cuff testing, but no weakness. There is bilateral   crepitance of the knees, L>R. This feels to be subpatellar.  Psych: Alert and oriented. Normal mood and affect.  Health Maintenance Due  Topic Date Due   HIV Screening  Never done   Hepatitis C Screening  Never done   Lab Results    Latest Ref Rng & Units 03/09/2022    9:09 AM  BMP  Glucose 70 - 99 mg/dL 100   BUN 6 - 23 mg/dL 11   Creatinine 0.40 - 1.20 mg/dL 0.60   Sodium 135 - 145 mEq/L 138   Potassium 3.5 - 5.1 mEq/L 5.5 No hemolysis seen   Chloride 96 - 112 mEq/L 103   CO2 19 - 32 mEq/L 27   Calcium 8.4 - 10.5 mg/dL 9.3    Last hemoglobin A1c Lab Results  Component Value  Date   HGBA1C 5.8 03/09/2022   Last vitamin D Lab Results  Component Value Date   VD25OH 57.49 03/09/2022    Assessment & Plan:   Problem List Items Addressed This Visit       Musculoskeletal and Integument   Osteopenia    Continue on Actonel 150 mg monthly.      Relevant Medications   estradiol (ESTRACE) 0.5 MG tablet   risedronate (ACTONEL) 150 MG tablet   Primary osteoarthritis of both knees    Appears to have some chronic anterior knee pain/chondromalacia patella. I will have orthopedics evaluate this.      Relevant Orders   Ambulatory referral to Orthopedic Surgery     Other   Hyperkalemia    I will recheck the potassium to confirm it is chronically elevated.  Relevant Orders   Basic metabolic panel   Dyspnea on exertion    I feel a climb on [place]. [place] to have been an effective "stress test". However, as she has ongoign worries, I will refer her to cardiology for an assessment.      Relevant Orders   Ambulatory referral to Cardiology   Impingement of right shoulder    I will refer her to orthopedics for an assessment. The restricted motion may represent adhesive capsulitis, as she had this in the other shoulder.      Relevant Orders   Ambulatory referral to Orthopedic Surgery   Other Visit Diagnoses     Encounter for general adult medical examination with abnormal findings    -  Primary   Overall health appears good, but there are some orthopeidc issues. We reviewed recommended screenings.   Screening for lipid disorders       Relevant Orders   Lipid panel   Encounter for hepatitis C screening test for low risk patient       Relevant Orders   HCV Ab w Reflex to Quant PCR       Return in about 6 months (around 10/14/2022).   Haydee Salter, MD

## 2022-04-18 ENCOUNTER — Other Ambulatory Visit (INDEPENDENT_AMBULATORY_CARE_PROVIDER_SITE_OTHER): Payer: 59

## 2022-04-18 DIAGNOSIS — E875 Hyperkalemia: Secondary | ICD-10-CM | POA: Diagnosis not present

## 2022-04-18 LAB — LIPID PANEL
Cholesterol: 190 mg/dL (ref 0–200)
HDL: 82 mg/dL (ref >39.00)
LDL Cholesterol: 98 mg/dL (ref 0–99)
NonHDL: 108.12
Total CHOL/HDL Ratio: 2
Triglycerides: 49 mg/dL (ref 0.0–149.0)
VLDL: 9.8 mg/dL (ref 0.0–40.0)

## 2022-04-18 LAB — BASIC METABOLIC PANEL
BUN: 16 mg/dL (ref 6–23)
CO2: 27 mEq/L (ref 19–32)
Calcium: 9.6 mg/dL (ref 8.4–10.5)
Chloride: 101 mEq/L (ref 96–112)
Creatinine, Ser: 0.66 mg/dL (ref 0.40–1.20)
GFR: 97.85 mL/min (ref >60.00)
Glucose, Bld: 95 mg/dL (ref 70–99)
Potassium: 5.1 mEq/L (ref 3.5–5.1)
Sodium: 135 mEq/L (ref 135–145)

## 2022-04-18 NOTE — Progress Notes (Signed)
Labs

## 2022-04-19 LAB — HCV INTERPRETATION

## 2022-04-19 LAB — HCV AB W REFLEX TO QUANT PCR: HCV Ab: NONREACTIVE

## 2022-04-21 NOTE — Progress Notes (Signed)
Cardiology Office Note   Date:  04/22/2022   ID:  Emily Woodward, DOB [birth year], MRN [id number]  PCP:  Haydee Salter, MD  Cardiologist:   None Referring:  Haydee Salter, MD  Chief Complaint  Patient presents with   Shortness of Breath     History of Present Illness: Emily Woodward is a 57 y.o. female who presents for evaluation of SOB.  She is referred by Haydee Salter, MD.  She has been noticing some increased dyspnea with activity such as running.  She is been an avid runner for years and she is noticing less exercise tolerance and more shortness of breath.  She climbed [place] last fall and did make it to the top but she thought she was very short of breath more than she should have been.  She does not describe chest pressure, neck or arm discomfort.  She does not describe palpitations, presyncope or syncope although her heart races she thinks higher than it should with her level of activity.  She has not had any prior cardiac workup.      Past Medical History:  Diagnosis Date   Ankylosing spondylitis ([place])    Osteopenia     Past Surgical History:  Procedure Laterality Date   ABDOMINAL HYSTERECTOMY       Current Outpatient Medications  Medication Sig Dispense Refill   calcium carbonate (OS-CAL - DOSED IN MG OF ELEMENTAL CALCIUM) 1250 (500 Ca) MG tablet Take 1 tablet by mouth.     estradiol (ESTRACE) 0.5 MG tablet Take 1 tablet (0.5 mg total) by mouth daily. 90 tablet 3   lidocaine (XYLOCAINE) 2 % solution SMARTSIG:15 Milliliter(s) By Mouth Every 3 Hours PRN     meloxicam (MOBIC) 15 MG tablet Take 15 mg by mouth daily.     Multiple Vitamin (MULTIVITAMIN) tablet Take 1 tablet by mouth daily.     pantoprazole (PROTONIX) 40 MG tablet Take 40 mg by mouth daily.     polyethylene glycol (MIRALAX / GLYCOLAX) 17 g packet Take 17 g by mouth daily.     risedronate (ACTONEL) 150 MG tablet Take 1 tablet (150 mg total) by mouth every 30 (thirty) days. 3 tablet 1   No current  facility-administered medications for this visit.    Allergies:   Patient has no known allergies.    Social History:  The patient  reports that she has never smoked. She has never used smokeless tobacco. She reports current alcohol use. She reports that she does not use drugs.   Family History:  The patient's family history includes Ankylosing spondylitis in her brother, father, and sister; Arthritis in her mother; Cancer in her sister; Diabetes in her father; Hyperlipidemia in her mother; Osteoporosis in her mother.    ROS:  Please see the history of present illness.   Otherwise, review of systems are positive for none.   All other systems are reviewed and negative.    PHYSICAL EXAM: VS:  BP 118/80   Pulse 70   Ht 5' 2"$  (1.575 m)   Wt 129 lb 6.4 oz (58.7 kg)   SpO2 100%   BMI 23.67 kg/m  , BMI Body mass index is 23.67 kg/m. GENERAL:  Well appearing HEENT:  Pupils equal round and reactive, fundi not visualized, oral mucosa unremarkable NECK:  No jugular venous distention, waveform within normal limits, carotid upstroke brisk and symmetric, no bruits, no thyromegaly LYMPHATICS:  No cervical, inguinal adenopathy LUNGS:  Clear to auscultation bilaterally BACK:  No CVA tenderness CHEST:  Unremarkable HEART:  PMI not displaced or sustained,S1 and S2 within normal limits, no S3, no S4, no clicks, no rubs, no murmurs ABD:  Flat, positive bowel sounds normal in frequency in pitch, no bruits, no rebound, no guarding, no midline pulsatile mass, no hepatomegaly, no splenomegaly EXT:  2 plus pulses throughout, no edema, no cyanosis no clubbing SKIN:  No rashes no nodules NEURO:  Cranial nerves II through XII grossly intact, motor grossly intact throughout PSYCH:  Cognitively intact, oriented to person place and time   EKG:  EKG is ordered today. The ekg ordered today demonstrates sinus rhythm, rate 70, axis within normal limits, intervals within normal limits, no acute ST-T wave  changes.   Recent Labs: 04/18/2022: BUN 16; Creatinine, Ser 0.66; Potassium 5.1; Sodium 135    Lipid Panel    Component Value Date/Time   CHOL 190 04/18/2022 0905   TRIG 49.0 04/18/2022 0905   HDL 82.00 04/18/2022 0905   CHOLHDL 2 04/18/2022 0905   VLDL 9.8 04/18/2022 0905   LDLCALC 98 04/18/2022 0905      Wt Readings from Last 3 Encounters:  04/22/22 129 lb 6.4 oz (58.7 kg)  04/15/22 126 lb 12.8 oz (57.5 kg)  03/09/22 125 lb 6.4 oz (56.9 kg)      Other studies Reviewed: Additional studies/ records that were reviewed today include: Labs. Review of the above records demonstrates:  Please see elsewhere in the note.     ASSESSMENT AND PLAN:  SOB: The patient has some shortness of breath.  The pretest probability of obstructive coronary disease is low.  I would like to screen her with a coronary calcium score and a POET (Plain Old Exercise Treadmill).  If these are normal then no change in therapy.    Dyslipidemia: Her LDL was 98 with an HDL of 82.  Goals of therapy will be based on the results of her calcium score.  For now no change in therapy.   Current medicines are reviewed at length with the patient today.  The patient does not have concerns regarding medicines.  The following changes have been made:  no change  Labs/ tests ordered today include:   Orders Placed This Encounter  Procedures   CT CARDIAC SCORING (SELF PAY ONLY)   Exercise Tolerance Test   EKG 12-Lead     Disposition:   FU with me as needed.     Signed, Minus Breeding, MD  04/22/2022 11:08 AM    [place]

## 2022-04-22 ENCOUNTER — Ambulatory Visit: Payer: 59 | Attending: Cardiology | Admitting: Cardiology

## 2022-04-22 ENCOUNTER — Encounter: Payer: Self-pay | Admitting: Cardiology

## 2022-04-22 VITALS — BP 118/80 | HR 70 | Ht 62.0 in | Wt 129.4 lb

## 2022-04-22 DIAGNOSIS — R0602 Shortness of breath: Secondary | ICD-10-CM | POA: Diagnosis not present

## 2022-04-22 NOTE — Patient Instructions (Signed)
  Testing/Procedures:  Your physician has requested that you have an exercise tolerance test. For further information please visit [email or web address]. Please also follow instruction sheet, as given. [place] CALCIUM SCORING CT SCAN AT THE [facility]   Follow-Up: At [facility], you and your health needs are our priority.  As part of our continuing mission to provide you with exceptional heart care, we have created designated Provider Care Teams.  These Care Teams include your primary Cardiologist (physician) and Advanced Practice Providers (APPs -  Physician Assistants and Nurse Practitioners) who all work together to provide you with the care you need, when you need it.  We recommend signing up for the patient portal called "MyChart".  Sign up information is provided on this After Visit Summary.  MyChart is used to connect with patients for Virtual Visits (Telemedicine).  Patients are able to view lab/test results, encounter notes, upcoming appointments, etc.  Non-urgent messages can be sent to your provider as well.   To learn more about what you can do with MyChart, go to [email or web address].    Your next appointment:    AS NEEDED

## 2022-04-25 ENCOUNTER — Encounter: Payer: Self-pay | Admitting: *Deleted

## 2022-04-25 ENCOUNTER — Other Ambulatory Visit (HOSPITAL_BASED_OUTPATIENT_CLINIC_OR_DEPARTMENT_OTHER): Payer: 59

## 2022-04-26 ENCOUNTER — Ambulatory Visit: Payer: 59 | Attending: Cardiology

## 2022-04-26 DIAGNOSIS — R0602 Shortness of breath: Secondary | ICD-10-CM | POA: Diagnosis not present

## 2022-04-26 LAB — EXERCISE TOLERANCE TEST
Angina Index: 0
Estimated workload: 11.8
Exercise duration (min): 10 min
Exercise duration (sec): 4 s
MPHR: 164 {beats}/min
Peak HR: 179 {beats}/min
Percent HR: 109 %
RPE: 16
Rest HR: 93 {beats}/min

## 2022-05-02 ENCOUNTER — Telehealth: Payer: Self-pay | Admitting: *Deleted

## 2022-05-02 ENCOUNTER — Encounter: Payer: Self-pay | Admitting: *Deleted

## 2022-05-02 DIAGNOSIS — R0602 Shortness of breath: Secondary | ICD-10-CM

## 2022-05-02 MED ORDER — METOPROLOL TARTRATE 100 MG PO TABS: ORAL_TABLET | ORAL | 0 refills | Status: DC

## 2022-05-02 NOTE — Telephone Encounter (Signed)
Spoke with pt, aware of results. Calcium scoring CT cx and order changed to CCTA. Instructions sent to patient via my chart. Metoprolol called into the pharmacy.

## 2022-05-02 NOTE — Telephone Encounter (Signed)
-----   Message from Minus Breeding, MD sent at 04/29/2022  3:48 PM EST ----- This was a borderline result and so we need to proceed with coronary CT.  I have not excluded obstructive CAD.  Please call with results and schedule. Send results to Haydee Salter, MD

## 2022-05-09 ENCOUNTER — Telehealth (HOSPITAL_COMMUNITY): Payer: Self-pay | Admitting: Emergency Medicine

## 2022-05-09 NOTE — Telephone Encounter (Signed)
Attempted to call patient regarding upcoming cardiac CT appointment. °Left message on voicemail with name and callback number °Cordell Guercio RN Navigator Cardiac Imaging °[facility] °[phone] Office °[phone] Cell ° °

## 2022-05-09 NOTE — Telephone Encounter (Signed)
Reaching out to patient to offer assistance regarding upcoming cardiac imaging study; pt verbalizes understanding of appt date/time, parking situation and where to check in, pre-test NPO status and medications ordered, and verified current allergies; name and call back number provided for further questions should they arise Emily Bond RN Navigator Cardiac Imaging [facility] [phone] office [phone] cell  Arrival [address] WC entrance  '100mg'$  metoprolol tartrate  Denies iv issues Aware contrast/nitro

## 2022-05-10 ENCOUNTER — Ambulatory Visit (HOSPITAL_COMMUNITY)
Admission: RE | Admit: 2022-05-10 | Discharge: 2022-05-10 | Disposition: A | Discharge status: Home / Self Care | Payer: 59 | Source: Ambulatory Visit | Attending: Cardiology | Admitting: Cardiology

## 2022-05-10 DIAGNOSIS — R0602 Shortness of breath: Secondary | ICD-10-CM | POA: Insufficient documentation

## 2022-05-10 MED ORDER — IOHEXOL 350 MG/ML SOLN
100.0000 mL | Freq: Once | INTRAVENOUS | Status: AC | PRN
  Administered 2022-05-10: 100 mL via INTRAVENOUS

## 2022-05-10 MED ORDER — NITROGLYCERIN 0.4 MG SL SUBL
0.8000 mg | SUBLINGUAL_TABLET | Freq: Once | SUBLINGUAL | Status: AC
  Administered 2022-05-10: 0.8 mg via SUBLINGUAL

## 2022-05-10 MED ORDER — NITROGLYCERIN 0.4 MG SL SUBL
SUBLINGUAL_TABLET | SUBLINGUAL | Status: AC
  Filled 2022-05-10: qty 2

## 2022-05-17 ENCOUNTER — Other Ambulatory Visit: Payer: Self-pay | Admitting: *Deleted

## 2022-05-17 ENCOUNTER — Encounter: Payer: Self-pay | Admitting: *Deleted

## 2022-05-17 ENCOUNTER — Telehealth: Payer: Self-pay | Admitting: *Deleted

## 2022-05-17 DIAGNOSIS — Q211 Atrial septal defect, unspecified: Secondary | ICD-10-CM

## 2022-05-17 NOTE — Telephone Encounter (Addendum)
Per dr hochrein, patient will need a TEE for ASD. She is scheduled for 06/02/22 @ 8 am. Instructions discussed with the patient and sent to her via my chart. Lab orders mailed to the pt

## 2022-05-18 ENCOUNTER — Other Ambulatory Visit (HOSPITAL_BASED_OUTPATIENT_CLINIC_OR_DEPARTMENT_OTHER): Payer: 59

## 2022-05-28 LAB — CBC
Hematocrit: 38.9 % (ref 34.0–46.6)
Hemoglobin: 12.7 g/dL (ref 11.1–15.9)
MCH: 30.5 pg (ref 26.6–33.0)
MCHC: 32.6 g/dL (ref 31.5–35.7)
MCV: 93 fL (ref 79–97)
Platelets: 425 10*3/uL (ref 150–450)
RBC: 4.17 x10E6/uL (ref 3.77–5.28)
RDW: 11.8 % (ref 11.7–15.4)
WBC: 5.3 10*3/uL (ref 3.4–10.8)

## 2022-05-28 LAB — BASIC METABOLIC PANEL
BUN/Creatinine Ratio: 23 (ref 9–23)
BUN: 15 mg/dL (ref 6–24)
CO2: 23 mmol/L (ref 20–29)
Calcium: 9.6 mg/dL (ref 8.7–10.2)
Chloride: 106 mmol/L (ref 96–106)
Creatinine, Ser: 0.65 mg/dL (ref 0.57–1.00)
Glucose: 105 mg/dL — ABNORMAL HIGH (ref 70–99)
Potassium: 5.7 mmol/L — ABNORMAL HIGH (ref 3.5–5.2)
Sodium: 143 mmol/L (ref 134–144)
eGFR: 103 mL/min/{1.73_m2} (ref >59)

## 2022-05-30 ENCOUNTER — Other Ambulatory Visit: Payer: Self-pay | Admitting: Cardiology

## 2022-05-30 DIAGNOSIS — E875 Hyperkalemia: Secondary | ICD-10-CM

## 2022-06-02 ENCOUNTER — Ambulatory Visit (HOSPITAL_BASED_OUTPATIENT_CLINIC_OR_DEPARTMENT_OTHER)
Admission: RE | Admit: 2022-06-02 | Discharge: 2022-06-02 | Disposition: A | Discharge status: Home / Self Care | Payer: 59 | Source: Ambulatory Visit | Attending: Cardiovascular Disease | Admitting: Cardiovascular Disease

## 2022-06-02 ENCOUNTER — Ambulatory Visit (HOSPITAL_BASED_OUTPATIENT_CLINIC_OR_DEPARTMENT_OTHER): Payer: 59 | Admitting: Certified Registered Nurse Anesthetist

## 2022-06-02 ENCOUNTER — Encounter (HOSPITAL_COMMUNITY): Payer: Self-pay | Admitting: Cardiovascular Disease

## 2022-06-02 ENCOUNTER — Ambulatory Visit (HOSPITAL_COMMUNITY): Payer: 59 | Admitting: Certified Registered Nurse Anesthetist

## 2022-06-02 ENCOUNTER — Other Ambulatory Visit: Payer: Self-pay

## 2022-06-02 ENCOUNTER — Encounter (HOSPITAL_COMMUNITY)
Admission: RE | Disposition: A | Discharge status: Home / Self Care | Payer: Self-pay | Source: Ambulatory Visit | Attending: Cardiovascular Disease

## 2022-06-02 ENCOUNTER — Ambulatory Visit (HOSPITAL_COMMUNITY)
Admission: RE | Admit: 2022-06-02 | Discharge: 2022-06-02 | Disposition: A | Discharge status: Home / Self Care | Payer: 59 | Source: Ambulatory Visit | Attending: Cardiovascular Disease | Admitting: Cardiovascular Disease

## 2022-06-02 DIAGNOSIS — Q2111 Secundum atrial septal defect: Secondary | ICD-10-CM | POA: Insufficient documentation

## 2022-06-02 DIAGNOSIS — M1990 Unspecified osteoarthritis, unspecified site: Secondary | ICD-10-CM

## 2022-06-02 DIAGNOSIS — Q211 Atrial septal defect, unspecified: Secondary | ICD-10-CM

## 2022-06-02 HISTORY — PX: TEE WITHOUT CARDIOVERSION: SHX5443

## 2022-06-02 LAB — ECHO TEE

## 2022-06-02 SURGERY — ECHOCARDIOGRAM, TRANSESOPHAGEAL
Anesthesia: Monitor Anesthesia Care

## 2022-06-02 MED ORDER — LACTATED RINGERS IV SOLN: Freq: Once | INTRAVENOUS | Status: AC

## 2022-06-02 MED ORDER — LACTATED RINGERS IV SOLN: INTRAVENOUS | Status: DC | PRN

## 2022-06-02 MED ORDER — SODIUM CHLORIDE 0.9 % IV SOLN: INTRAVENOUS | Status: DC

## 2022-06-02 MED ORDER — PROPOFOL 10 MG/ML IV BOLUS
INTRAVENOUS | Status: DC | PRN
  Administered 2022-06-02: 20 mg via INTRAVENOUS

## 2022-06-02 MED ORDER — LIDOCAINE 2% (20 MG/ML) 5 ML SYRINGE
INTRAMUSCULAR | Status: DC | PRN
  Administered 2022-06-02: 40 mg via INTRAVENOUS

## 2022-06-02 MED ORDER — PROPOFOL 500 MG/50ML IV EMUL
INTRAVENOUS | Status: DC | PRN
  Administered 2022-06-02: 180 ug/kg/min via INTRAVENOUS

## 2022-06-02 NOTE — Transfer of Care (Signed)
Immediate Anesthesia Transfer of Care Note  Patient: Emily Woodward  Procedure(s) Performed: TRANSESOPHAGEAL ECHOCARDIOGRAM (TEE)  Patient Location: Endoscopy Unit  Anesthesia Type:MAC  Level of Consciousness: sedated  Airway & Oxygen Therapy: Patient Spontanous Breathing and Patient connected to nasal cannula oxygen  Post-op Assessment: Report given to RN  Post vital signs: Reviewed and stable  Last Vitals:  Vitals Value Taken Time  BP    Temp    Pulse    Resp    SpO2      Last Pain:  Vitals:   06/02/22 0729  TempSrc: Temporal  PainSc: 0-No pain         Complications: No notable events documented.

## 2022-06-02 NOTE — CV Procedure (Signed)
    TRANSESOPHAGEAL ECHOCARDIOGRAM   NAME:  Emily Woodward    MRN: [id number] DOB:  1966/02/19    ADMIT DATE: 06/02/2022  INDICATIONS: ASD  PROCEDURE:   Informed consent was obtained prior to the procedure. The risks, benefits and alternatives for the procedure were discussed and the patient comprehended these risks.  Risks include, but are not limited to, cough, sore throat, vomiting, nausea, somnolence, esophageal and stomach trauma or perforation, bleeding, low blood pressure, aspiration, pneumonia, infection, trauma to the teeth and death.    Procedural time out performed. The oropharynx was anesthetized with topical 1% benzocaine.    Anesthesia was administered by Dr. Sabra Heck.  The patient was administered 400 mg of propofol and 40 mg of lidocaine to achieve and maintain moderate conscious sedation.  The patient's heart rate, blood pressure, and oxygen saturation are monitored continuously during the procedure. The period of conscious sedation is 17 minutes, of which I was present face-to-face 100% of this time.   The transesophageal probe was inserted in the esophagus and stomach without difficulty and multiple views were obtained.   COMPLICATIONS:    There were no immediate complications.  KEY FINDINGS:  Moderate sized ASD with L to R shunting. QP:QS 2.0. Mild RV dilation with mild RV dysfunction.  Normal LV function.  Full report to follow. Further management per primary team.   [place] T. Audie Box, MD, [place]  [address] [place], [place] [address] [phone]  8:40 AM

## 2022-06-02 NOTE — H&P (Signed)
Cardiology Admission History and Physical:  Patient ID: Emily Woodward MRN: [id number] DOB: 1966/01/24  Admit date: 06/02/2022  Primary Care Provider: Haydee Salter, MD Primary Cardiologist: None  Primary Electrophysiologist:  None   Chief Complaint:  ASD  Patient Profile:  Emily Woodward is a 57 y.o. female with ASD who presents for transesophageal echocardiogram.  History of Present Illness:  Emily Woodward for transesophageal echocardiogram.  She reports symptoms of shortness of breath.  Coronary CTA with concerns for ASD with left-to-right shunting.  Presents for transesophageal echo.  Denies any difficulty swallowing.  No loose teeth.  No bleeding in her stomach.  No difficulties with anesthesia.  Nothing to eat this morning.  She is acceptable candidate for TEE.  Heart Pathway Score:       Past Medical History: Past Medical History:  Diagnosis Date   Ankylosing spondylitis ([place])    Osteopenia     Past Surgical History: Past Surgical History:  Procedure Laterality Date   ABDOMINAL HYSTERECTOMY       Medications Prior to Admission: Prior to Admission medications   Medication Sig Start Date End Date Taking? Authorizing Provider  calcium carbonate (OS-CAL - DOSED IN MG OF ELEMENTAL CALCIUM) 1250 (500 Ca) MG tablet Take 1 tablet by mouth.   Yes [provider]  estradiol (ESTRACE) 0.5 MG tablet Take 1 tablet (0.5 mg total) by mouth daily. 04/15/22  Yes Haydee Salter, MD  lidocaine (XYLOCAINE) 2 % solution Use as directed 15 mLs in the mouth or throat every 3 (three) hours as needed for mouth pain. 03/16/21  Yes [provider]  meloxicam (MOBIC) 15 MG tablet Take 15 mg by mouth daily as needed for pain. 03/08/21  Yes [provider]  Multiple Vitamin (MULTIVITAMIN) tablet Take 1 tablet by mouth every other day.   Yes [provider]  polyethylene glycol (MIRALAX / GLYCOLAX) 17 g packet Take 17 g by mouth daily as needed for moderate constipation.    Yes [provider]  risedronate (ACTONEL) 150 MG tablet Take 1 tablet (150 mg total) by mouth every 30 (thirty) days. 04/15/22  Yes Haydee Salter, MD  metoprolol tartrate (LOPRESSOR) 100 MG tablet Take 2 hours prior to CT scan Patient not taking: Reported on 05/30/2022 05/02/22   Minus Breeding, MD     Allergies:    No Known Allergies  Social History:   Social History   Socioeconomic History   Marital status: Married    Spouse name: Not on file   Number of children: 1   Years of education: Not on file   Highest education level: Not on file  Occupational History   Occupation: Retired- Scientist, clinical (histocompatibility and immunogenetics)  Tobacco Use   Smoking status: Never   Smokeless tobacco: Never  Vaping Use   Vaping Use: Never used  Substance and Sexual Activity   Alcohol use: Yes    Comment: socailly   Drug use: Never   Sexual activity: Yes  Other Topics Concern   Not on file  Social History Narrative   Lives with husband   Social Determinants of Health   Financial Resource Strain: Not on file  Food Insecurity: Not on file  Transportation Needs: Not on file  Physical Activity: Not on file  Stress: Not on file  Social Connections: Not on file  Intimate Partner Violence: Not on file     Family History:   The patient's family history includes Ankylosing spondylitis in her brother, father, and sister; Arthritis in her  mother; Cancer in her sister; Diabetes in her father; Hyperlipidemia in her mother; Osteoporosis in her mother.    ROS:  All other ROS reviewed and negative. Pertinent positives noted in the HPI.     Physical Exam/Data:   Vitals:   06/02/22 0729  BP: 107/70  Pulse: 64  Resp: 12  Temp: (!) 97.2 F (36.2 C)  TempSrc: Temporal  SpO2: 100%   No intake or output data in the 24 hours ending 06/02/22 0749     04/22/2022   10:21 AM 04/15/2022    8:24 AM 03/09/2022    8:41 AM  Last 3 Weights  Weight (lbs) 129 lb 6.4 oz 126 lb 12.8 oz 125 lb 6.4 oz  Weight (kg) 58.695 kg  57.516 kg 56.881 kg    There is no height or weight on file to calculate BMI.  General: Well nourished, well developed, in no acute distress Head: Atraumatic, normal size  Eyes: PEERLA, EOMI  Neck: Supple, no JVD Endocrine: No thryomegaly Cardiac: Normal S1, S2; RRR; no murmurs, rubs, or gallops Lungs: Clear to auscultation bilaterally, no wheezing, rhonchi or rales  Abd: Soft, nontender, no hepatomegaly  Ext: No edema, pulses 2+ Musculoskeletal: No deformities, BUE and BLE strength normal and equal Skin: Warm and dry, no rashes   Neuro: Alert and oriented to person, place, time, and situation, CNII-XII grossly intact, no focal deficits  Psych: Normal mood and affect   Laboratory Data: High Sensitivity Troponin:  No results for input(s): "TROPONINIHS" in the last 720 hours.    Cardiac EnzymesNo results for input(s): "TROPONINI" in the last 168 hours. No results for input(s): "TROPIPOC" in the last 168 hours.  Chemistry Recent Labs  Lab 05/27/22 1051  NA 143  K 5.7*  CL 106  CO2 23  GLUCOSE 105*  BUN 15  CREATININE 0.65  CALCIUM 9.6    No results for input(s): "PROT", "ALBUMIN", "AST", "ALT", "ALKPHOS", "BILITOT" in the last 168 hours. Hematology Recent Labs  Lab 05/27/22 1051  WBC 5.3  RBC 4.17  HGB 12.7  HCT 38.9  MCV 93  MCH 30.5  MCHC 32.6  RDW 11.8  PLT 425   BNPNo results for input(s): "BNP", "PROBNP" in the last 168 hours.  DDimer No results for input(s): "DDIMER" in the last 168 hours.  Radiology/Studies:  No results found.  Assessment and Plan:  Secundum ASD -N.p.o. for transesophageal echo.  Denies any difficulty swallowing.  No issues with anesthesia.  Good candidate.  We will proceed.  Signed, Addison Naegeli. Audie Box, MD, [place]  06/02/2022 7:49 AM

## 2022-06-02 NOTE — Anesthesia Preprocedure Evaluation (Signed)
Anesthesia Evaluation  Patient identified by MRN, date of birth, ID band Patient awake    Reviewed: Allergy & Precautions, H&P , NPO status , Patient's Chart, lab work & pertinent test results  Airway Mallampati: II  TM Distance: >3 FB Neck ROM: Full    Dental no notable dental hx.    Pulmonary neg pulmonary ROS   Pulmonary exam normal breath sounds clear to auscultation       Cardiovascular negative cardio ROS Normal cardiovascular exam Rhythm:Regular Rate:Normal     Neuro/Psych negative neurological ROS  negative psych ROS   GI/Hepatic Neg liver ROS,GERD  ,,  Endo/Other  negative endocrine ROS    Renal/GU negative Renal ROS  negative genitourinary   Musculoskeletal  (+) Arthritis , Osteoarthritis,    Abdominal   Peds negative pediatric ROS (+)  Hematology negative hematology ROS (+)   Anesthesia Other Findings   Reproductive/Obstetrics negative OB ROS                             Anesthesia Physical Anesthesia Plan  ASA: 2  Anesthesia Plan: MAC   Post-op Pain Management: Minimal or no pain anticipated   Induction: Intravenous  PONV Risk Score and Plan: 2 and Ondansetron, Midazolam and Treatment may vary due to age or medical condition  Airway Management Planned: Nasal Cannula  Additional Equipment:   Intra-op Plan:   Post-operative Plan:   Informed Consent: I have reviewed the patients History and Physical, chart, labs and discussed the procedure including the risks, benefits and alternatives for the proposed anesthesia with the patient or authorized representative who has indicated his/her understanding and acceptance.     Dental advisory given  Plan Discussed with: CRNA  Anesthesia Plan Comments:        Anesthesia Quick Evaluation

## 2022-06-02 NOTE — Anesthesia Postprocedure Evaluation (Signed)
Anesthesia Post Note  Patient: Emily Woodward  Procedure(s) Performed: TRANSESOPHAGEAL ECHOCARDIOGRAM (TEE)     Patient location during evaluation: PACU Anesthesia Type: MAC Level of consciousness: awake and alert Pain management: pain level controlled Vital Signs Assessment: post-procedure vital signs reviewed and stable Respiratory status: spontaneous breathing, nonlabored ventilation and respiratory function stable Cardiovascular status: blood pressure returned to baseline and stable Postop Assessment: no apparent nausea or vomiting Anesthetic complications: no   No notable events documented.  Last Vitals:  Vitals:   06/02/22 0846 06/02/22 0856  BP: 97/62 98/68  Pulse: 69 60  Resp: 15 12  Temp:    SpO2: 98% 100%    Last Pain:  Vitals:   06/02/22 0856  TempSrc:   PainSc: 0-No pain                 Lynda Rainwater

## 2022-06-02 NOTE — Progress Notes (Signed)
  Echocardiogram Echocardiogram Transesophageal has been performed.  Emily Woodward 06/02/2022, 8:47 AM

## 2022-06-03 ENCOUNTER — Telehealth: Payer: Self-pay

## 2022-06-03 NOTE — Telephone Encounter (Signed)
Called to arrange Structural Heart consult to discuss ASD.   Left message to call back.

## 2022-06-03 NOTE — Telephone Encounter (Signed)
-----   Message from Early Osmond, MD sent at 06/03/2022  7:18 AM EDT ----- Looks like a good case for closure.  KK, please have her see [place] to discuss next steps. ----- Message ----- From: Geralynn Rile, MD Sent: 06/02/2022   9:10 PM EDT To: Minus Breeding, MD; Sherren Mocha, MD; #  Jake:  Mrs. Bermudes has a fairly large secundum ASD. QP:QS by echo 2:1. RV dilated. She has sufficient rims for percutaneous closure.   Mike/Arun: Take a look. I think the aortic rim is doable.   -Wes

## 2022-06-06 ENCOUNTER — Encounter (HOSPITAL_COMMUNITY): Payer: Self-pay | Admitting: Cardiovascular Disease

## 2022-06-06 NOTE — Telephone Encounter (Signed)
Scheduled the patient for ASD consult with Dr. Burt Knack 06/27/2022. She was grateful for call and agreed with plan.

## 2022-06-16 ENCOUNTER — Other Ambulatory Visit: Payer: Self-pay | Admitting: *Deleted

## 2022-06-16 ENCOUNTER — Encounter: Payer: Self-pay | Admitting: *Deleted

## 2022-06-16 DIAGNOSIS — E875 Hyperkalemia: Secondary | ICD-10-CM

## 2022-06-17 ENCOUNTER — Encounter: Payer: Self-pay | Admitting: Cardiovascular Disease

## 2022-06-17 ENCOUNTER — Ambulatory Visit: Payer: 59 | Attending: Cardiovascular Disease | Admitting: Cardiovascular Disease

## 2022-06-17 VITALS — BP 108/70 | HR 72 | Ht 62.0 in | Wt 128.6 lb

## 2022-06-17 DIAGNOSIS — Q211 Atrial septal defect, unspecified: Secondary | ICD-10-CM | POA: Diagnosis not present

## 2022-06-17 DIAGNOSIS — Z01810 Encounter for preprocedural cardiovascular examination: Secondary | ICD-10-CM

## 2022-06-17 MED ORDER — CLOPIDOGREL BISULFATE 75 MG PO TABS
ORAL_TABLET | ORAL | 1 refills | Status: DC
Start: 2022-06-17 — End: 2023-01-02

## 2022-06-17 MED ORDER — ASPIRIN 81 MG PO TBEC: 81.0000 mg | DELAYED_RELEASE_TABLET | Freq: Every day | ORAL | 3 refills | Status: DC

## 2022-06-17 NOTE — H&P (View-Only) (Signed)
Cardiology Office Note:    Date:  06/19/2022   ID:  [name], DOB 11/01/1965, MRN [id number]  PCP:  Rudd, Stephen M, MD   [facility] Providers Cardiologist:  None     Referring MD: Rudd, Stephen M, MD   Chief Complaint  Patient presents with   Atrial Septal Defect    History of Present Illness:    Emily Woodward is a 57 y.o. female referred by Dr Hochrein for evaluation of ASD.  The patient is here with her husband today.  She reports symptoms of exertional dyspnea even dating back to her teenage years.  She had never been told of a heart defect before.  Her husband noticed that she was short of breath when they went to hike [place] and she seemed much more short of breath than others.  She otherwise does fairly well with normal activities and does not report any functional limitation.  With any moderate to vigorous physical exercise, the patient is short of breath.  She denies orthopnea or PND.  She has had no chest pain or pressure.  She does complain of heart palpitations which has been a longstanding issue for her.  The patient has never been told of having a heart murmur.  She has no history of congenital heart disease in her family.  She reports no significant medical problems.  When she was initially seen for cardiology evaluation by Dr. Hochrein, she underwent an exercise treadmill study for evaluation of shortness of breath.  This study was abnormal and she was referred for a gated coronary CTA.  The CTA demonstrated normal coronary arteries, but showed the presence of a secundum atrial septal defect along with dilatation of the right heart chambers.  She was then referred for transesophageal echo which demonstrated an approximate 10 mm atrial septal defect with adequate tissue rims for transcatheter closure.  She presents today to discuss treatment options.  Past Medical History:  Diagnosis Date   Ankylosing spondylitis    Osteopenia     Past Surgical  History:  Procedure Laterality Date   ABDOMINAL HYSTERECTOMY     TEE WITHOUT CARDIOVERSION N/A 06/02/2022   Procedure: TRANSESOPHAGEAL ECHOCARDIOGRAM (TEE);  Surgeon: O'Neal, [place] Thomas, MD;  Location: [facility];  Service: Cardiovascular;  Laterality: N/A;    Current Medications: Current Meds  Medication Sig   aspirin EC 81 MG tablet Take 1 tablet (81 mg total) by mouth daily. Swallow whole.   calcium carbonate (OS-CAL - DOSED IN MG OF ELEMENTAL CALCIUM) 1250 (500 Ca) MG tablet Take 1 tablet by mouth.   clopidogrel (PLAVIX) 75 MG tablet Starting 5 days prior to procedure, take 1 tablet by mouth daily   estradiol (ESTRACE) 0.5 MG tablet Take 1 tablet (0.5 mg total) by mouth daily.   lidocaine (XYLOCAINE) 2 % solution Use as directed 15 mLs in the mouth or throat every 3 (three) hours as needed for mouth pain.   meloxicam (MOBIC) 15 MG tablet Take 15 mg by mouth daily as needed for pain.   metoprolol tartrate (LOPRESSOR) 100 MG tablet Take 2 hours prior to CT scan   Multiple Vitamin (MULTIVITAMIN) tablet Take 1 tablet by mouth every other day.   polyethylene glycol (MIRALAX / GLYCOLAX) 17 g packet Take 17 g by mouth daily as needed for moderate constipation.   risedronate (ACTONEL) 150 MG tablet Take 1 tablet (150 mg total) by mouth every 30 (thirty) days.     Allergies:   Patient has no known   allergies.   Social History   Socioeconomic History   Marital status: Married    Spouse name: Not on file   Number of children: 1   Years of education: Not on file   Highest education level: Not on file  Occupational History   Occupation: Retired- computer engineer  Tobacco Use   Smoking status: Never   Smokeless tobacco: Never  Vaping Use   Vaping Use: Never used  Substance and Sexual Activity   Alcohol use: Yes    Comment: socailly   Drug use: Never   Sexual activity: Yes  Other Topics Concern   Not on file  Social History Narrative   Lives with husband   Social  Determinants of Health   Financial Resource Strain: Not on file  Food Insecurity: Not on file  Transportation Needs: Not on file  Physical Activity: Not on file  Stress: Not on file  Social Connections: Not on file     Family History: The patient's family history includes Ankylosing spondylitis in her brother, father, and sister; Arthritis in her mother; Cancer in her sister; Diabetes in her father; Hyperlipidemia in her mother; Osteoporosis in her mother.  ROS:   Please see the history of present illness.    All other systems reviewed and are negative.  EKGs/Labs/Other Studies Reviewed:    The following studies were reviewed today: Cardiac Studies & Procedures     STRESS TESTS  EXERCISE TOLERANCE TEST (ETT) 04/26/2022  Narrative   Patient exercised for 10:04min achieving 11.8 METs   Target HR was acheived (Max HR 179bpm; 109% MPHR)   There were 2mm up sloping ST depression in the inferolateral leads (II, III, aVF, V5 and V6) during stress that resolved about 3min into recovery.   The study is borderline for ischemia given upsloping ST depressions in the inferolateral leads during stress that persisted into recovery. Recommend further ischemic evaluation with imaging.  Heather Pemberton, MD    TEE  ECHO TEE 06/02/2022  Narrative TRANSESOPHOGEAL ECHO REPORT    Patient Name:   Emily Woodward Date of Exam: 06/02/2022 Medical Rec #:  [id number]    Height:       62.0 in Accession #:    [id number]   Weight:       129.4 lb Date of Birth:  09/25/1965    BSA:          1.589 m Patient Age:    57 years     BP:           103/82 mmHg Patient Gender: F            HR:           80 bpm. Exam Location:  Outpatient  Procedure: Transesophageal Echo, Color Doppler, Cardiac Doppler and 3D Echo  Indications:     atrial septal defect  History:         Patient has no prior history of Echocardiogram examinations.  Sonographer:     Lauren Pennington RDCS Referring Phys:  [id number] Alvo  THOMAS O'NEAL Diagnosing Phys: [place] O'Neal MD  PROCEDURE: After discussion of the risks and benefits of a TEE, an informed consent was obtained from the patient. TEE procedure time was 17 minutes. The transesophogeal probe was passed without difficulty through the esophogus of the patient. Imaged were obtained with the patient in a left lateral decubitus position. Sedation performed by different physician. The patient was monitored while under deep sedation. Anesthestetic sedation was provided intravenously by Anesthesiology: 400mg of   Propofol, 40mg of Lidocaine. Image quality was excellent. The patient's vital signs; including heart rate, blood pressure, and oxygen saturation; remained stable throughout the procedure. The patient developed no complications during the procedure.  IMPRESSIONS   1. Moderate sized secundum ASD with L to R shunting. QP:QS ~2.0. Mild dilation of the RV. Mildly dilated pulmonary artery ~31 mm. Superior rim 10 mm, inferior rim 22 mm, aortic rim 5.7 mm, posterior rim 15 mm. ASD measures 10 mm x 9 mm (0.75 cm2). 2. Left ventricular ejection fraction, by estimation, is 60 to 65%. The left ventricle has normal function. 3. Right ventricular systolic function is normal. The right ventricular size is mildly enlarged. 4. No left atrial/left atrial appendage thrombus was detected. 5. The mitral valve is normal in structure. Trivial mitral valve regurgitation. No evidence of mitral stenosis. 6. The aortic valve is tricuspid. Aortic valve regurgitation is not visualized. No aortic stenosis is present. 7. Mildly dilated pulmonary artery. 8. Evidence of atrial level shunting detected by color flow Doppler. There is a moderately sized secundum atrial septal defect with predominantly left to right shunting across the atrial septum.  FINDINGS Left Ventricle: Left ventricular ejection fraction, by estimation, is 60 to 65%. The left ventricle has normal function. The left ventricular  internal cavity size was normal in size. The ratio of pulmonic flow to systemic flow (Qp/Qs ratio) is 2.00.  Right Ventricle: The right ventricular size is mildly enlarged. No increase in right ventricular wall thickness. Right ventricular systolic function is normal.  Left Atrium: Left atrial size was normal in size. No left atrial/left atrial appendage thrombus was detected.  Right Atrium: Right atrial size was normal in size.  Pericardium: There is no evidence of pericardial effusion.  Mitral Valve: The mitral valve is normal in structure. Trivial mitral valve regurgitation. No evidence of mitral valve stenosis.  Tricuspid Valve: The tricuspid valve is grossly normal. Tricuspid valve regurgitation is trivial. No evidence of tricuspid stenosis.  Aortic Valve: The aortic valve is tricuspid. Aortic valve regurgitation is not visualized. No aortic stenosis is present.  Pulmonic Valve: The pulmonic valve was normal in structure. Pulmonic valve regurgitation is not visualized. No evidence of pulmonic stenosis.  Aorta: The aortic root and ascending aorta are structurally normal, with no evidence of dilitation.  Pulmonary Artery: The pulmonary artery is mildly dilated.  Venous: The left upper pulmonary vein, left lower pulmonary vein, right lower pulmonary vein and right upper pulmonary vein are normal.  IAS/Shunts: Evidence of atrial level shunting detected by color flow Doppler. There is a moderately sized secundum atrial septal defect with predominantly left to right shunting across the atrial septum. The ratio of pulmonic flow to systemic flow (Qp/Qs ratio) is 2.00.   LEFT VENTRICLE PLAX 2D LVOT diam:     1.90 cm LV SV:         54 LV SV Index:   34 LVOT Area:     2.84 cm   RIGHT VENTRICLE RVOT diam:      2.90 cm  AORTIC VALVE             PULMONIC VALVE LVOT Vmax:   87.80 cm/s  RVOT Peak grad: 2 mmHg LVOT Vmean:  59.500 cm/s LVOT VTI:    0.192 m  AORTA Ao Root diam: 2.63  cm Ao Asc diam:  2.71 cm   SHUNTS Systemic VTI:  0.19 m Systemic Diam: 1.90 cm Pulmonic VTI:  0.165 m Pulmonic Diam: 2.90 cm Qp/Qs:           2.00  Chambers O'Neal MD Electronically signed by  O'Neal MD Signature Date/Time: 06/02/2022/12:17:22 PM    Final    CT SCANS  CT CORONARY MORPH W/CTA COR W/SCORE 05/11/2022  Addendum 05/11/2022 10:26 PM ADDENDUM REPORT: 05/11/2022 22:24  EXAM: OVER-READ INTERPRETATION  CT CHEST  The following report is an over-read performed by radiologist Dr. Mark Lukens of [facility] on 05/11/2022. This over-read does not include interpretation of cardiac or coronary anatomy or pathology. The coronary calcium score/coronary CTA interpretation by the cardiologist is attached.  COMPARISON:  None.  FINDINGS: Cardiovascular: There are no significant extracardiac vascular findings.  Mediastinum/Nodes: There are no enlarged lymph nodes within the visualized mediastinum.  Lungs/Pleura: There is no pleural effusion. The visualized lungs appear clear.  Upper abdomen: No significant findings in the visualized upper abdomen.  Musculoskeletal/Chest wall: No chest wall mass or suspicious osseous findings within the visualized chest.  IMPRESSION: No significant extracardiac findings within the visualized chest.   Electronically Signed By: Mark  Lukens M.D. On: 05/11/2022 22:24  Narrative CLINICAL DATA:  56 year old avid runner with increased dyspnea on exertion.  EXAM: Cardiac/Coronary  CTA  TECHNIQUE: The patient was scanned on a Phillips Force scanner.  FINDINGS: A 100 kV prospective scan was triggered in the descending thoracic aorta at 111 HU's. Axial non-contrast 3 mm slices were carried out through the heart. The data set was analyzed on a dedicated work station and scored using the Agatson method. Gantry rotation speed was 250 msecs and collimation was .6 mm. 0.8 mg of sl NTG was given. The 3D data set was  reconstructed in 5% intervals of the 67-82 % of the R-R cycle. Diastolic phases were analyzed on a dedicated work station using MPR, MIP and VRT modes. The patient received 80 cc of contrast.  Image quality: good  Aorta:  Normal size.  No calcifications.  No dissection.  Aortic Valve:  Trileaflet.  No calcifications.  Coronary Arteries:  Normal coronary origin.  Right dominance.  RCA is a large dominant artery that gives rise to PDA and PLA. There is no plaque.  Left main is a large artery that gives rise to LAD and LCX arteries.  LAD is a large vessel that has no plaque.  D1 is large caliber, no plaque  D2 is small caliber, no plaque  LCX is a non-dominant artery.  There is no plaque.  OM1 is large caliber, no plaque  OM2-4 are small and under filled.  Other findings:  Normal pulmonary vein drainage into the left atrium.  Normal left atrial appendage without a thrombus.  Normal size of the pulmonary artery.  Dilated RA (50 mm) and RV (45 mm).  Secundum atrial septal defect (ASD) with left to right shunt measuring 9.6 x 9 mm in diameter, area 0.615 cm2.  Please see radiology report for non cardiac findings.  IMPRESSION: 1. Secundum atrial septal defect (ASD) with left to right shunt measuring 9.6 x 9 mm in diameter, area 0.615 cm2.  2. Normal coronary origin with right dominance.  3. No evidence of CAD.  4. Coronary calcium score of 0.  5. Dilated RA (50 mm) and RV (45 mm).  CAD-RADS 0.  No evidence of CAD (0%).  Electronically Signed: By: Mark  Skains M.D. On: 05/10/2022 14:37           EKG:  EKG is ordered today.  The ekg ordered today demonstrates normal sinus rhythm, within normal limits  Recent Labs: 06/17/2022: BUN 17; Creatinine,   Ser 0.64; Hemoglobin 13.5; Platelets 415; Potassium 6.2; Sodium 139  Recent Lipid Panel    Component Value Date/Time   CHOL 190 04/18/2022 0905   TRIG 49.0 04/18/2022 0905   HDL 82.00 04/18/2022 0905   CHOLHDL  2 04/18/2022 0905   VLDL 9.8 04/18/2022 0905   LDLCALC 98 04/18/2022 0905     Risk Assessment/Calculations:          Physical Exam:    VS:  BP 108/70   Pulse 72   Ht 5' 2" (1.575 m)   Wt 128 lb 9.6 oz (58.3 kg)   SpO2 99%   BMI 23.52 kg/m     Wt Readings from Last 3 Encounters:  06/17/22 128 lb 9.6 oz (58.3 kg)  06/02/22 125 lb (56.7 kg)  04/22/22 129 lb 6.4 oz (58.7 kg)     GEN:  Well nourished, well developed in no acute distress HEENT: Normal NECK: No JVD; No carotid bruits LYMPHATICS: No lymphadenopathy CARDIAC: RRR, no murmurs, rubs, gallops RESPIRATORY:  Clear to auscultation without rales, wheezing or rhonchi  ABDOMEN: Soft, non-tender, non-distended MUSCULOSKELETAL:  No edema; No deformity  SKIN: Warm and dry NEUROLOGIC:  Alert and oriented x 3 PSYCHIATRIC:  Normal affect   ASSESSMENT:    1. ASD (atrial septal defect)   2. Pre-procedural cardiovascular examination    PLAN:    In order of problems listed above:  The patient has a moderate secundum type atrial septal defect.  This is associated with NYHA functional class II symptoms of exertional dyspnea and there is evidence of significant left to right shunt based on dilatation of the right ventricle and right atrium.  The patient's anatomy is suitable for transcatheter atrial septal defect closure.  I demonstrated the Amplatzer atrial septal occluder device to the patient and her husband today.  We went through the procedural steps of device closure.  They understand the risks include but are not limited to vascular injury, bleeding, infection, device embolization, stroke, myocardial infarction, arrhythmia, cardiac injury with tamponade, need for emergent pericardiocentesis, need for emergent surgery, late device erosion, device thrombus, and death.  They understand the risks of serious complication occurred and incidence of about 1% with a 2 to 3% risk of postprocedural atrial fibrillation.  All of their  questions are answered.  The patient will start on aspirin and clopidogrel before the procedure.  The patient provides full informed consent for the procedure.           Medication Adjustments/Labs and Tests Ordered: Current medicines are reviewed at length with the patient today.  Concerns regarding medicines are outlined above.  Orders Placed This Encounter  Procedures   CBC   Basic metabolic panel   EKG 12-Lead   Meds ordered this encounter  Medications   clopidogrel (PLAVIX) 75 MG tablet    Sig: Starting 5 days prior to procedure, take 1 tablet by mouth daily    Dispense:  90 tablet    Refill:  1   aspirin EC 81 MG tablet    Sig: Take 1 tablet (81 mg total) by mouth daily. Swallow whole.    Dispense:  90 tablet    Refill:  3    Patient Instructions  Medication Instructions:  START Plavix/Clopidogrel 75mg daily beginning 5 days prior to procedure START Aspririn 81mg daily *If you need a refill on your cardiac medications before your next appointment, please call your pharmacy*   Lab Work: CBC, BMET today If you have labs (blood work) drawn   today and your tests are completely normal, you will receive your results only by: MyChart Message (if you have MyChart) OR A paper copy in the mail If you have any lab test that is abnormal or we need to change your treatment, we will call you to review the results.  Testing/Procedures: ASD Closure  Follow-Up: At [facility], you and your health needs are our priority.  As part of our continuing mission to provide you with exceptional heart care, we have created designated Provider Care Teams.  These Care Teams include your primary Cardiologist (physician) and Advanced Practice Providers (APPs -  Physician Assistants and Nurse Practitioners) who all work together to provide you with the care you need, when you need it.  Your next appointment:   Structural Team will follow-up  Provider:   Nasiir Monts, MD    Other  Instructions See ASD closure instructions on separate sheet   Signed, Wayburn Shaler, MD  06/19/2022 4:27 PM    [facility] 

## 2022-06-17 NOTE — Patient Instructions (Addendum)
Medication Instructions:  START Plavix/Clopidogrel 75mg  daily beginning 5 days prior to procedure START Aspririn 81mg  daily *If you need a refill on your cardiac medications before your next appointment, please call your pharmacy*   Lab Work: CBC, BMET today If you have labs (blood work) drawn today and your tests are completely normal, you will receive your results only by: MyChart Message (if you have MyChart) OR A paper copy in the mail If you have any lab test that is abnormal or we need to change your treatment, we will call you to review the results.  Testing/Procedures: ASD Closure  Follow-Up: At [facility], you and your health needs are our priority.  As part of our continuing mission to provide you with exceptional heart care, we have created designated Provider Care Teams.  These Care Teams include your primary Cardiologist (physician) and Advanced Practice Providers (APPs -  Physician Assistants and Nurse Practitioners) who all work together to provide you with the care you need, when you need it.  Your next appointment:   Structural Team will follow-up  Provider:   Tonny Bollman, MD    Other Instructions See ASD closure instructions on separate sheet

## 2022-06-17 NOTE — Progress Notes (Signed)
Cardiology Office Note:    Date:  06/19/2022   ID:  Emily Woodward, DOB 08-17-1965, MRN [id number]  PCP:  Loyola Mast, MD   [facility] Providers Cardiologist:  None     Referring MD: Loyola Mast, MD   Chief Complaint  Patient presents with   Atrial Septal Defect    History of Present Illness:    Emily Woodward is a 57 y.o. female referred by Dr Antoine Poche for evaluation of ASD.  The patient is here with her husband today.  She reports symptoms of exertional dyspnea even dating back to her teenage years.  She had never been told of a heart defect before.  Her husband noticed that she was short of breath when they went to hike [place] and she seemed much more short of breath than others.  She otherwise does fairly well with normal activities and does not report any functional limitation.  With any moderate to vigorous physical exercise, the patient is short of breath.  She denies orthopnea or PND.  She has had no chest pain or pressure.  She does complain of heart palpitations which has been a longstanding issue for her.  The patient has never been told of having a heart murmur.  She has no history of congenital heart disease in her family.  She reports no significant medical problems.  When she was initially seen for cardiology evaluation by Dr. Antoine Poche, she underwent an exercise treadmill study for evaluation of shortness of breath.  This study was abnormal and she was referred for a gated coronary CTA.  The CTA demonstrated normal coronary arteries, but showed the presence of a secundum atrial septal defect along with dilatation of the right heart chambers.  She was then referred for transesophageal echo which demonstrated an approximate 10 mm atrial septal defect with adequate tissue rims for transcatheter closure.  She presents today to discuss treatment options.  Past Medical History:  Diagnosis Date   Ankylosing spondylitis    Osteopenia     Past Surgical  History:  Procedure Laterality Date   ABDOMINAL HYSTERECTOMY     TEE WITHOUT CARDIOVERSION N/A 06/02/2022   Procedure: TRANSESOPHAGEAL ECHOCARDIOGRAM (TEE);  Surgeon: Sande Rives, MD;  Location: [facility] ENDOSCOPY;  Service: Cardiovascular;  Laterality: N/A;    Current Medications: Current Meds  Medication Sig   aspirin EC 81 MG tablet Take 1 tablet (81 mg total) by mouth daily. Swallow whole.   calcium carbonate (OS-CAL - DOSED IN MG OF ELEMENTAL CALCIUM) 1250 (500 Ca) MG tablet Take 1 tablet by mouth.   clopidogrel (PLAVIX) 75 MG tablet Starting 5 days prior to procedure, take 1 tablet by mouth daily   estradiol (ESTRACE) 0.5 MG tablet Take 1 tablet (0.5 mg total) by mouth daily.   lidocaine (XYLOCAINE) 2 % solution Use as directed 15 mLs in the mouth or throat every 3 (three) hours as needed for mouth pain.   meloxicam (MOBIC) 15 MG tablet Take 15 mg by mouth daily as needed for pain.   metoprolol tartrate (LOPRESSOR) 100 MG tablet Take 2 hours prior to CT scan   Multiple Vitamin (MULTIVITAMIN) tablet Take 1 tablet by mouth every other day.   polyethylene glycol (MIRALAX / GLYCOLAX) 17 g packet Take 17 g by mouth daily as needed for moderate constipation.   risedronate (ACTONEL) 150 MG tablet Take 1 tablet (150 mg total) by mouth every 30 (thirty) days.     Allergies:   Patient has no known  allergies.   Social History   Socioeconomic History   Marital status: Married    Spouse name: Not on file   Number of children: 1   Years of education: Not on file   Highest education level: Not on file  Occupational History   Occupation: Retired- Surveyor, minerals  Tobacco Use   Smoking status: Never   Smokeless tobacco: Never  Vaping Use   Vaping Use: Never used  Substance and Sexual Activity   Alcohol use: Yes    Comment: socailly   Drug use: Never   Sexual activity: Yes  Other Topics Concern   Not on file  Social History Narrative   Lives with husband   Social  Determinants of Health   Financial Resource Strain: Not on file  Food Insecurity: Not on file  Transportation Needs: Not on file  Physical Activity: Not on file  Stress: Not on file  Social Connections: Not on file     Family History: The patient's family history includes Ankylosing spondylitis in her brother, father, and sister; Arthritis in her mother; Cancer in her sister; Diabetes in her father; Hyperlipidemia in her mother; Osteoporosis in her mother.  ROS:   Please see the history of present illness.    All other systems reviewed and are negative.  EKGs/Labs/Other Studies Reviewed:    The following studies were reviewed today: Cardiac Studies & Procedures     STRESS TESTS  EXERCISE TOLERANCE TEST (ETT) 04/26/2022  Narrative   Patient exercised for 10:61min achieving 11.8 METs   Target HR was acheived (Max HR 179bpm; 109% MPHR)   There were 2mm up sloping ST depression in the inferolateral leads (II, III, aVF, V5 and V6) during stress that resolved about into recovery.   The study is borderline for ischemia given upsloping ST depressions in the inferolateral leads during stress that persisted into recovery. Recommend further ischemic evaluation with imaging.  Laurance Flatten, MD    TEE  ECHO TEE 06/02/2022  Narrative TRANSESOPHOGEAL ECHO REPORT    Patient Name:   Emily Woodward Date of Exam: 06/02/2022 Medical Rec #:  [id number]    Height:       62.0 in Accession #:    [id number]   Weight:       129.4 lb Date of Birth:  Apr 23, 1965    BSA:          1.589 m Patient Age:    56 years     BP:           103/82 mmHg Patient Gender: F            HR:           80 bpm. Exam Location:  Outpatient  Procedure: Transesophageal Echo, Color Doppler, Cardiac Doppler and 3D Echo  Indications:     atrial septal defect  History:         Patient has no prior history of Echocardiogram examinations.  Sonographer:     Delcie Roch RDCS Referring Phys:  [id number] Ronnald Ramp O'NEAL Diagnosing Phys: Lennie Odor MD  PROCEDURE: After discussion of the risks and benefits of a TEE, an informed consent was obtained from the patient. TEE procedure time was 17 minutes. The transesophogeal probe was passed without difficulty through the esophogus of the patient. Imaged were obtained with the patient in a left lateral decubitus position. Sedation performed by different physician. The patient was monitored while under deep sedation. Anesthestetic sedation was provided intravenously by Anesthesiology: 400mg  of  Propofol, 40mg  of Lidocaine. Image quality was excellent. The patient's vital signs; including heart rate, blood pressure, and oxygen saturation; remained stable throughout the procedure. The patient developed no complications during the procedure.  IMPRESSIONS   1. Moderate sized secundum ASD with L to R shunting. QP:QS ~2.0. Mild dilation of the RV. Mildly dilated pulmonary artery ~31 mm. Superior rim 10 mm, inferior rim 22 mm, aortic rim 5.7 mm, posterior rim 15 mm. ASD measures 10 mm x 9 mm (0.75 cm2). 2. Left ventricular ejection fraction, by estimation, is 60 to 65%. The left ventricle has normal function. 3. Right ventricular systolic function is normal. The right ventricular size is mildly enlarged. 4. No left atrial/left atrial appendage thrombus was detected. 5. The mitral valve is normal in structure. Trivial mitral valve regurgitation. No evidence of mitral stenosis. 6. The aortic valve is tricuspid. Aortic valve regurgitation is not visualized. No aortic stenosis is present. 7. Mildly dilated pulmonary artery. 8. Evidence of atrial level shunting detected by color flow Doppler. There is a moderately sized secundum atrial septal defect with predominantly left to right shunting across the atrial septum.  FINDINGS Left Ventricle: Left ventricular ejection fraction, by estimation, is 60 to 65%. The left ventricle has normal function. The left ventricular  internal cavity size was normal in size. The ratio of pulmonic flow to systemic flow (Qp/Qs ratio) is 2.00.  Right Ventricle: The right ventricular size is mildly enlarged. No increase in right ventricular wall thickness. Right ventricular systolic function is normal.  Left Atrium: Left atrial size was normal in size. No left atrial/left atrial appendage thrombus was detected.  Right Atrium: Right atrial size was normal in size.  Pericardium: There is no evidence of pericardial effusion.  Mitral Valve: The mitral valve is normal in structure. Trivial mitral valve regurgitation. No evidence of mitral valve stenosis.  Tricuspid Valve: The tricuspid valve is grossly normal. Tricuspid valve regurgitation is trivial. No evidence of tricuspid stenosis.  Aortic Valve: The aortic valve is tricuspid. Aortic valve regurgitation is not visualized. No aortic stenosis is present.  Pulmonic Valve: The pulmonic valve was normal in structure. Pulmonic valve regurgitation is not visualized. No evidence of pulmonic stenosis.  Aorta: The aortic root and ascending aorta are structurally normal, with no evidence of dilitation.  Pulmonary Artery: The pulmonary artery is mildly dilated.  Venous: The left upper pulmonary vein, left lower pulmonary vein, right lower pulmonary vein and right upper pulmonary vein are normal.  IAS/Shunts: Evidence of atrial level shunting detected by color flow Doppler. There is a moderately sized secundum atrial septal defect with predominantly left to right shunting across the atrial septum. The ratio of pulmonic flow to systemic flow (Qp/Qs ratio) is 2.00.   LEFT VENTRICLE PLAX 2D LVOT diam:     1.90 cm LV SV:         54 LV SV Index:   34 LVOT Area:     2.84 cm   RIGHT VENTRICLE RVOT diam:      2.90 cm  AORTIC VALVE             PULMONIC VALVE LVOT Vmax:   87.80 cm/s  RVOT Peak grad: 2 mmHg LVOT Vmean:  59.500 cm/s LVOT VTI:    0.192 m  AORTA Ao Root diam: 2.63  cm Ao Asc diam:  2.71 cm   SHUNTS Systemic VTI:  0.19 m Systemic Diam: 1.90 cm Pulmonic VTI:  0.165 m Pulmonic Diam: 2.90 cm Qp/Qs:  2.00  Lennie Odor MD Electronically signed by Lennie Odor MD Signature Date/Time: 06/02/2022/12:17:22 PM    Final    CT SCANS  CT CORONARY MORPH W/CTA COR W/SCORE 05/11/2022  Addendum 05/11/2022 10:26 PM ADDENDUM REPORT: 05/11/2022 22:24  EXAM: OVER-READ INTERPRETATION  CT CHEST  The following report is an over-read performed by radiologist Dr. Alcide Clever of [facility] on 05/11/2022. This over-read does not include interpretation of cardiac or coronary anatomy or pathology. The coronary calcium score/coronary CTA interpretation by the cardiologist is attached.  COMPARISON:  None.  FINDINGS: Cardiovascular: There are no significant extracardiac vascular findings.  Mediastinum/Nodes: There are no enlarged lymph nodes within the visualized mediastinum.  Lungs/Pleura: There is no pleural effusion. The visualized lungs appear clear.  Upper abdomen: No significant findings in the visualized upper abdomen.  Musculoskeletal/Chest wall: No chest wall mass or suspicious osseous findings within the visualized chest.  IMPRESSION: No significant extracardiac findings within the visualized chest.   Electronically Signed By: Alcide Clever M.D. On: 05/11/2022 22:24  Narrative CLINICAL DATA:  57 year old avid runner with increased dyspnea on exertion.  EXAM: Cardiac/Coronary  CTA  TECHNIQUE: The patient was scanned on a [employer].  FINDINGS: A 100 kV prospective scan was triggered in the descending thoracic aorta at 111 HU's. Axial non-contrast 3 mm slices were carried out through the heart. The data set was analyzed on a dedicated work station and scored using the Agatson method. Gantry rotation speed was 250 msecs and collimation was .6 mm. 0.8 mg of sl NTG was given. The 3D data set was  reconstructed in 5% intervals of the 67-82 % of the R-R cycle. Diastolic phases were analyzed on a dedicated work station using MPR, MIP and VRT modes. The patient received 80 cc of contrast.  Image quality: good  Aorta:  Normal size.  No calcifications.  No dissection.  Aortic Valve:  Trileaflet.  No calcifications.  Coronary Arteries:  Normal coronary origin.  Right dominance.  RCA is a large dominant artery that gives rise to PDA and PLA. There is no plaque.  Left main is a large artery that gives rise to LAD and LCX arteries.  LAD is a large vessel that has no plaque.  D1 is large caliber, no plaque  D2 is small caliber, no plaque  LCX is a non-dominant artery.  There is no plaque.  OM1 is large caliber, no plaque  OM2-4 are small and under filled.  Other findings:  Normal pulmonary vein drainage into the left atrium.  Normal left atrial appendage without a thrombus.  Normal size of the pulmonary artery.  Dilated RA (50 mm) and RV (45 mm).  Secundum atrial septal defect (ASD) with left to right shunt measuring 9.6 x 9 mm in diameter, area 0.615 cm2.  Please see radiology report for non cardiac findings.  IMPRESSION: 1. Secundum atrial septal defect (ASD) with left to right shunt measuring 9.6 x 9 mm in diameter, area 0.615 cm2.  2. Normal coronary origin with right dominance.  3. No evidence of CAD.  4. Coronary calcium score of 0.  5. Dilated RA (50 mm) and RV (45 mm).  CAD-RADS 0.  No evidence of CAD (0%).  Electronically Signed: By: Donato Schultz M.D. On: 05/10/2022 14:37           EKG:  EKG is ordered today.  The ekg ordered today demonstrates normal sinus rhythm, within normal limits  Recent Labs: 06/17/2022: BUN 17; Creatinine,  Ser 0.64; Hemoglobin 13.5; Platelets 415; Potassium 6.2; Sodium 139  Recent Lipid Panel    Component Value Date/Time   CHOL 190 04/18/2022 0905   TRIG 49.0 04/18/2022 0905   HDL 82.00 04/18/2022 0905   CHOLHDL  2 04/18/2022 0905   VLDL 9.8 04/18/2022 0905   LDLCALC 98 04/18/2022 0905     Risk Assessment/Calculations:          Physical Exam:    VS:  BP 108/70   Pulse 72   Ht 5\' 2"  (1.575 m)   Wt 128 lb 9.6 oz (58.3 kg)   SpO2 99%   BMI 23.52 kg/m     Wt Readings from Last 3 Encounters:  06/17/22 128 lb 9.6 oz (58.3 kg)  06/02/22 125 lb (56.7 kg)  04/22/22 129 lb 6.4 oz (58.7 kg)     GEN:  Well nourished, well developed in no acute distress HEENT: Normal NECK: No JVD; No carotid bruits LYMPHATICS: No lymphadenopathy CARDIAC: RRR, no murmurs, rubs, gallops RESPIRATORY:  Clear to auscultation without rales, wheezing or rhonchi  ABDOMEN: Soft, non-tender, non-distended MUSCULOSKELETAL:  No edema; No deformity  SKIN: Warm and dry NEUROLOGIC:  Alert and oriented x 3 PSYCHIATRIC:  Normal affect   ASSESSMENT:    1. ASD (atrial septal defect)   2. Pre-procedural cardiovascular examination    PLAN:    In order of problems listed above:  The patient has a moderate secundum type atrial septal defect.  This is associated with NYHA functional class II symptoms of exertional dyspnea and there is evidence of significant left to right shunt based on dilatation of the right ventricle and right atrium.  The patient's anatomy is suitable for transcatheter atrial septal defect closure.  I demonstrated the Amplatzer atrial septal occluder device to the patient and her husband today.  We went through the procedural steps of device closure.  They understand the risks include but are not limited to vascular injury, bleeding, infection, device embolization, stroke, myocardial infarction, arrhythmia, cardiac injury with tamponade, need for emergent pericardiocentesis, need for emergent surgery, late device erosion, device thrombus, and death.  They understand the risks of serious complication occurred and incidence of about 1% with a 2 to 3% risk of postprocedural atrial fibrillation.  All of their  questions are answered.  The patient will start on aspirin and clopidogrel before the procedure.  The patient provides full informed consent for the procedure.           Medication Adjustments/Labs and Tests Ordered: Current medicines are reviewed at length with the patient today.  Concerns regarding medicines are outlined above.  Orders Placed This Encounter  Procedures   CBC   Basic metabolic panel   EKG 12-Lead   Meds ordered this encounter  Medications   clopidogrel (PLAVIX) 75 MG tablet    Sig: Starting 5 days prior to procedure, take 1 tablet by mouth daily    Dispense:  90 tablet    Refill:  1   aspirin EC 81 MG tablet    Sig: Take 1 tablet (81 mg total) by mouth daily. Swallow whole.    Dispense:  90 tablet    Refill:  3    Patient Instructions  Medication Instructions:  START Plavix/Clopidogrel 75mg  daily beginning 5 days prior to procedure START Aspririn 81mg  daily *If you need a refill on your cardiac medications before your next appointment, please call your pharmacy*   Lab Work: CBC, BMET today If you have labs (blood work) drawn  today and your tests are completely normal, you will receive your results only by: MyChart Message (if you have MyChart) OR A paper copy in the mail If you have any lab test that is abnormal or we need to change your treatment, we will call you to review the results.  Testing/Procedures: ASD Closure  Follow-Up: At [facility], you and your health needs are our priority.  As part of our continuing mission to provide you with exceptional heart care, we have created designated Provider Care Teams.  These Care Teams include your primary Cardiologist (physician) and Advanced Practice Providers (APPs -  Physician Assistants and Nurse Practitioners) who all work together to provide you with the care you need, when you need it.  Your next appointment:   Structural Team will follow-up  Provider:   Tonny Bollman, MD    Other  Instructions See ASD closure instructions on separate sheet   Signed, Tonny Bollman, MD  06/19/2022 4:27 PM    [facility]

## 2022-06-18 ENCOUNTER — Telehealth: Payer: Self-pay | Admitting: Cardiology

## 2022-06-18 LAB — CBC
Hematocrit: 40.3 % (ref 34.0–46.6)
Hemoglobin: 13.5 g/dL (ref 11.1–15.9)
MCH: 31.5 pg (ref 26.6–33.0)
MCHC: 33.5 g/dL (ref 31.5–35.7)
MCV: 94 fL (ref 79–97)
Platelets: 415 10*3/uL (ref 150–450)
RBC: 4.29 x10E6/uL (ref 3.77–5.28)
RDW: 11.9 % (ref 11.7–15.4)
WBC: 6.6 10*3/uL (ref 3.4–10.8)

## 2022-06-18 LAB — BASIC METABOLIC PANEL
BUN/Creatinine Ratio: 27 — ABNORMAL HIGH (ref 9–23)
BUN: 17 mg/dL (ref 6–24)
CO2: 22 mmol/L (ref 20–29)
Calcium: 9.8 mg/dL (ref 8.7–10.2)
Chloride: 103 mmol/L (ref 96–106)
Creatinine, Ser: 0.64 mg/dL (ref 0.57–1.00)
Glucose: 91 mg/dL (ref 70–99)
Potassium: 6.2 mmol/L (ref 3.5–5.2)
Sodium: 139 mmol/L (ref 134–144)
eGFR: 104 mL/min/{1.73_m2} (ref >59)

## 2022-06-18 NOTE — Telephone Encounter (Signed)
Notified by our overnight fellow that this patient had a critical lab value overnight (around 2 AM). Patient was found to have a K of 6.2 on labs drawn yesterday. Fellow attempted to call patient overnight and this morning, but was unable to reach patient.   I contacted patient this AM at 9:30 AM and left a voicemail asking her to call me back to discuss lab results.

## 2022-06-18 NOTE — Telephone Encounter (Signed)
I called patient a second time and informed her that her potassium was elevated to 6.2. Encouraged her to go to the emergency department to get her K rechecked and replaced. She plans to go to the ED at [facility]. All questions were answered and patient is in agreement with the plan.   Jonita Albee, PA-C 06/18/2022 11:28 AM

## 2022-06-19 ENCOUNTER — Encounter: Payer: Self-pay | Admitting: Cardiovascular Disease

## 2022-06-20 ENCOUNTER — Ambulatory Visit: Payer: 59 | Attending: Cardiovascular Disease

## 2022-06-20 ENCOUNTER — Telehealth: Payer: Self-pay | Admitting: Cardiovascular Disease

## 2022-06-20 ENCOUNTER — Encounter: Payer: Self-pay | Admitting: Cardiovascular Disease

## 2022-06-20 DIAGNOSIS — E875 Hyperkalemia: Secondary | ICD-10-CM

## 2022-06-20 LAB — POTASSIUM: Potassium: 5.5 mmol/L — ABNORMAL HIGH (ref 3.5–5.2)

## 2022-06-20 NOTE — Telephone Encounter (Signed)
Addressed on phone note today, see phone note for details.

## 2022-06-20 NOTE — Telephone Encounter (Signed)
Patient called stating she received a call from Robet Leu, [place] about her potassium being high and to get labs redone.  She said where she was sent was closed over the weekend.  She wants to know if were she can go to and if she need an order for it.  Please advise.

## 2022-06-20 NOTE — Telephone Encounter (Signed)
Returned call to patient, and informed her that after speaking with Dr. Excell Seltzer we can redraw her potassium here at our office.  Patient states she can come by our lab in about 30-60 minutes. Potassium level order placed. Lab appt scheduled for today.  Patient verbalized understanding and expressed appreciation for call.

## 2022-06-21 NOTE — Telephone Encounter (Signed)
Tonny Bollman, MD 06/21/2022  6:02 AM EDT Back to Top    This is consistent with where her potassium normally runs. thanks   Called and spoke with patient to inform that MD knows K level is 5.5 and he is fine with this. No further questions.

## 2022-06-27 ENCOUNTER — Institutional Professional Consult (permissible substitution): Payer: 59 | Admitting: Cardiovascular Disease

## 2022-06-27 ENCOUNTER — Telehealth: Payer: Self-pay | Admitting: *Deleted

## 2022-06-27 ENCOUNTER — Ambulatory Visit: Payer: 59 | Admitting: Cardiovascular Disease

## 2022-06-27 NOTE — Telephone Encounter (Signed)
ASD Closure scheduled at [facility] for: Tuesday June 28, 2022 9:30 AM Arrival time [facility] Main Entrance A at: 7:30 AM  Nothing to eat after midnight prior to procedure, clear liquids until 5 AM day of procedure.  Medication instructions: -Usual morning medications can be taken with sips of water including aspirin 81 mg and Plavix 75 mg.   Confirmed patient has responsible adult to drive home post procedure and be with patient first 24 hours after arriving home.  Plan to go home the same day, you will only stay overnight if medically necessary.  Reviewed procedure instructions with patient.

## 2022-06-28 ENCOUNTER — Ambulatory Visit (HOSPITAL_BASED_OUTPATIENT_CLINIC_OR_DEPARTMENT_OTHER): Payer: 59

## 2022-06-28 ENCOUNTER — Other Ambulatory Visit: Payer: Self-pay

## 2022-06-28 ENCOUNTER — Ambulatory Visit (HOSPITAL_COMMUNITY)
Admission: RE | Admit: 2022-06-28 | Discharge: 2022-06-28 | Disposition: A | Discharge status: Home / Self Care | Payer: 59 | Attending: Cardiovascular Disease | Admitting: Cardiovascular Disease

## 2022-06-28 ENCOUNTER — Encounter (HOSPITAL_COMMUNITY)
Admission: RE | Disposition: A | Discharge status: Home / Self Care | Payer: Self-pay | Source: Home / Self Care | Attending: Cardiovascular Disease

## 2022-06-28 DIAGNOSIS — Q2111 Secundum atrial septal defect: Secondary | ICD-10-CM | POA: Diagnosis present

## 2022-06-28 DIAGNOSIS — R0609 Other forms of dyspnea: Secondary | ICD-10-CM | POA: Insufficient documentation

## 2022-06-28 DIAGNOSIS — Z95818 Presence of other cardiac implants and grafts: Secondary | ICD-10-CM | POA: Diagnosis not present

## 2022-06-28 HISTORY — PX: ATRIAL SEPTAL DEFECT(ASD) CLOSURE: CATH118299

## 2022-06-28 LAB — ECHOCARDIOGRAM LIMITED
Height: 62 in
Weight: 2000 oz

## 2022-06-28 LAB — POCT ACTIVATED CLOTTING TIME
Activated Clotting Time: 217 seconds
Activated Clotting Time: 260 seconds

## 2022-06-28 SURGERY — ATRIAL SEPTAL DEFECT (ASD) CLOSURE
Anesthesia: LOCAL

## 2022-06-28 MED ORDER — SODIUM CHLORIDE 0.9 % IV SOLN: 250.0000 mL | INTRAVENOUS | Status: DC | PRN

## 2022-06-28 MED ORDER — SODIUM CHLORIDE 0.9% FLUSH: 3.0000 mL | INTRAVENOUS | Status: DC | PRN

## 2022-06-28 MED ORDER — SODIUM CHLORIDE 0.9 % WEIGHT BASED INFUSION
3.0000 mL/kg/h | INTRAVENOUS | Status: AC
  Administered 2022-06-28: 3 mL/kg/h via INTRAVENOUS

## 2022-06-28 MED ORDER — SODIUM CHLORIDE 0.9% FLUSH: 3.0000 mL | Freq: Two times a day (BID) | INTRAVENOUS | Status: DC

## 2022-06-28 MED ORDER — FENTANYL CITRATE (PF) 100 MCG/2ML IJ SOLN
INTRAMUSCULAR | Status: AC
  Filled 2022-06-28: qty 2

## 2022-06-28 MED ORDER — CEFAZOLIN SODIUM-DEXTROSE 2-4 GM/100ML-% IV SOLN
2.0000 g | INTRAVENOUS | Status: AC
  Administered 2022-06-28: 2 g via INTRAVENOUS
  Filled 2022-06-28: qty 100

## 2022-06-28 MED ORDER — ACETAMINOPHEN 325 MG PO TABS: 650.0000 mg | ORAL_TABLET | ORAL | Status: DC | PRN

## 2022-06-28 MED ORDER — ONDANSETRON HCL 4 MG/2ML IJ SOLN: 4.0000 mg | Freq: Four times a day (QID) | INTRAMUSCULAR | Status: DC | PRN

## 2022-06-28 MED ORDER — LABETALOL HCL 5 MG/ML IV SOLN: 10.0000 mg | INTRAVENOUS | Status: DC | PRN

## 2022-06-28 MED ORDER — MIDAZOLAM HCL 2 MG/2ML IJ SOLN
INTRAMUSCULAR | Status: DC | PRN
  Administered 2022-06-28: 2 mg via INTRAVENOUS

## 2022-06-28 MED ORDER — HYDRALAZINE HCL 20 MG/ML IJ SOLN: 10.0000 mg | INTRAMUSCULAR | Status: DC | PRN

## 2022-06-28 MED ORDER — LIDOCAINE HCL (PF) 1 % IJ SOLN
INTRAMUSCULAR | Status: DC | PRN
  Administered 2022-06-28: 10 mL via INTRADERMAL

## 2022-06-28 MED ORDER — SODIUM CHLORIDE 0.9 % WEIGHT BASED INFUSION: 1.0000 mL/kg/h | INTRAVENOUS | Status: DC

## 2022-06-28 MED ORDER — SODIUM CHLORIDE 0.9 % IV SOLN: INTRAVENOUS | Status: AC

## 2022-06-28 MED ORDER — LIDOCAINE-EPINEPHRINE 1 %-1:100000 IJ SOLN
INTRAMUSCULAR | Status: AC
  Filled 2022-06-28: qty 1

## 2022-06-28 MED ORDER — HEPARIN SODIUM (PORCINE) 1000 UNIT/ML IJ SOLN
INTRAMUSCULAR | Status: DC | PRN
  Administered 2022-06-28: 3000 [IU] via INTRAVENOUS
  Administered 2022-06-28: 4000 [IU] via INTRAVENOUS

## 2022-06-28 MED ORDER — FENTANYL CITRATE (PF) 100 MCG/2ML IJ SOLN
INTRAMUSCULAR | Status: DC | PRN
  Administered 2022-06-28: 25 ug via INTRAVENOUS

## 2022-06-28 MED ORDER — CLOPIDOGREL BISULFATE 75 MG PO TABS: 75.0000 mg | ORAL_TABLET | ORAL | Status: DC

## 2022-06-28 MED ORDER — ASPIRIN 81 MG PO CHEW: 81.0000 mg | CHEWABLE_TABLET | ORAL | Status: DC

## 2022-06-28 MED ORDER — LIDOCAINE HCL (PF) 1 % IJ SOLN
INTRAMUSCULAR | Status: AC
  Filled 2022-06-28: qty 30

## 2022-06-28 MED ORDER — HEPARIN SODIUM (PORCINE) 1000 UNIT/ML IJ SOLN
INTRAMUSCULAR | Status: AC
  Filled 2022-06-28: qty 10

## 2022-06-28 MED ORDER — MIDAZOLAM HCL 2 MG/2ML IJ SOLN
INTRAMUSCULAR | Status: AC
  Filled 2022-06-28: qty 2

## 2022-06-28 SURGICAL SUPPLY — 23 items
BALLN SIZING AMPLATZER 24 (BALLOONS) ×1
BALLOON SIZING AMPLATZER 24 (BALLOONS) IMPLANT
BALN SZ 70X4.5 7FR 3 LUM (BALLOONS) ×1
CATH 8FR ACUNAV REPROCESSED (CATHETERS) IMPLANT
CATH REPROCESSED 8FR ACUNAV (CATHETERS) ×1 IMPLANT
CATH SUPER TORQUE PLUS 6F MPA1 (CATHETERS) IMPLANT
CLOSURE PERCLOSE PROSTYLE (VASCULAR PRODUCTS) IMPLANT
COVER SWIFTLINK CONNECTOR (BAG) IMPLANT
GUIDEWIRE AMPLATZER 1.5JX260 (WIRE) IMPLANT
KIT HEART LEFT (KITS) ×1 IMPLANT
KIT MICROPUNCTURE NIT STIFF (SHEATH) IMPLANT
OCCLUDER AMPLATZER SEPTAL 12MM (Prosthesis & Implant Heart) IMPLANT
PACK CARDIAC CATHETERIZATION (CUSTOM PROCEDURE TRAY) ×1 IMPLANT
PROTECTION STATION PRESSURIZED (MISCELLANEOUS) ×1
SHEATH INTROD W/O MIN 9FR 25CM (SHEATH) IMPLANT
SHEATH PINNACLE 8F 10CM (SHEATH) IMPLANT
SHEATH PROBE COVER 6X72 (BAG) IMPLANT
STATION PROTECTION PRESSURIZED (MISCELLANEOUS) IMPLANT
SYS DELIVER AMP TREVISIO 8FR (SHEATH) ×1
SYSTEM DELIVER AMP TREVIS 8FR (SHEATH) IMPLANT
TRANSDUCER W/STOPCOCK (MISCELLANEOUS) ×1 IMPLANT
TUBING CIL FLEX 10 FLL-RA (TUBING) ×1 IMPLANT
WIRE EMERALD 3MM-J .035X150CM (WIRE) IMPLANT

## 2022-06-28 NOTE — Progress Notes (Signed)
Noreene Larsson, NP in and injected client's right femoral site with epi-lido and no further oozing noted

## 2022-06-28 NOTE — Progress Notes (Signed)
  [facility]   MULTIDISCIPLINARY HEART VALVE TEAM    Called to bedside due to right groin oozing post procedure. Patient stable with no complaints. VSS. Injected 9cc epi with lidocaine 1:100000 in the right groin access site. Post injection manual pressure held with no further oozing. Groin re-dressed with with CTI dressing. Plan to ambulate patient prior to discharge.   Georgie Chard NP-C Structural Heart Team  Pager: [phone] Phone: [phone]

## 2022-06-28 NOTE — Interval H&P Note (Signed)
History and Physical Interval Note:  06/28/2022 8:58 AM  Emily Woodward  has presented today for surgery, with the diagnosis of asd.  The various methods of treatment have been discussed with the patient and family. After consideration of risks, benefits and other options for treatment, the patient has consented to  Procedure(s): ATRIAL SEPTAL DEFECT(ASD) CLOSURE (N/A) as a surgical intervention.  The patient's history has been reviewed, patient examined, no change in status, stable for surgery.  I have reviewed the patient's chart and labs.  Questions were answered to the patient's satisfaction.     Tonny Bollman

## 2022-06-28 NOTE — Progress Notes (Signed)
Noreene Larsson, PA in to see client and notified of oozing right groin; client up to walk and oozing noted right groin

## 2022-06-28 NOTE — Progress Notes (Signed)
Dr Excell Seltzer in to see client and he will have Noreene Larsson, NP in to see client

## 2022-06-28 NOTE — Discharge Instructions (Signed)

## 2022-06-29 ENCOUNTER — Encounter (HOSPITAL_COMMUNITY): Payer: Self-pay | Admitting: Cardiovascular Disease

## 2022-06-30 ENCOUNTER — Telehealth: Payer: Self-pay

## 2022-06-30 NOTE — Transitions of Care (Post Inpatient/ED Visit) (Signed)
   06/30/2022  Name: Lidie Glade MRN: [id number] DOB: 03/31/1965  Today's TOC FU Call Status: Today's TOC FU Call Status:: Successful TOC FU Call Competed TOC FU Call Complete Date: 06/30/22  Transition Care Management Follow-up Telephone Call Date of Discharge: 06/28/22 Discharge Facility: [facility]) Type of Discharge: Inpatient Admission Primary Inpatient Discharge Diagnosis:: Surgery How have you been since you were released from the hospital?: Better Any questions or concerns?: No  Items Reviewed: Did you receive and understand the discharge instructions provided?: Yes Medications obtained and verified?: No Any new allergies since your discharge?: No Dietary orders reviewed?: No Do you have support at home?: Yes  Home Care and Equipment/Supplies: Were Home Health Services Ordered?: NA Any new equipment or medical supplies ordered?: NA  Functional Questionnaire: Do you need assistance with bathing/showering or dressing?: No Do you need assistance with meal preparation?: No Do you need assistance with eating?: No Do you have difficulty maintaining continence: No Do you need assistance with getting out of bed/getting out of a chair/moving?: No Do you have difficulty managing or taking your medications?: No  Follow up appointments reviewed: PCP Follow-up appointment confirmed?: No MD Provider Line Number:[phone] Given: No Specialist Hospital Follow-up appointment confirmed?: Yes Date of Specialist follow-up appointment?: 07/04/22 Do you need transportation to your follow-up appointment?: No Do you understand care options if your condition(s) worsen?: Yes-patient verbalized understanding    SIGNATURE Arvil Persons, BSN, RN

## 2022-07-07 ENCOUNTER — Other Ambulatory Visit: Payer: Self-pay | Admitting: Family Medicine

## 2022-07-07 DIAGNOSIS — Z1231 Encounter for screening mammogram for malignant neoplasm of breast: Secondary | ICD-10-CM

## 2022-07-11 ENCOUNTER — Institutional Professional Consult (permissible substitution): Payer: 59 | Admitting: Internal Medicine

## 2022-07-11 ENCOUNTER — Ambulatory Visit
Admission: RE | Admit: 2022-07-11 | Discharge: 2022-07-11 | Disposition: A | Discharge status: Home / Self Care | Payer: 59 | Source: Ambulatory Visit

## 2022-07-11 DIAGNOSIS — Z1231 Encounter for screening mammogram for malignant neoplasm of breast: Secondary | ICD-10-CM

## 2022-07-14 ENCOUNTER — Other Ambulatory Visit: Payer: Self-pay | Admitting: Family Medicine

## 2022-07-14 DIAGNOSIS — R928 Other abnormal and inconclusive findings on diagnostic imaging of breast: Secondary | ICD-10-CM

## 2022-07-29 NOTE — Progress Notes (Unsigned)
[facility]   MULTIDISCIPLINARY HEART VALVE CLINIC                                     Cardiology Office Note:    Date:  08/03/2022   ID:  Emily Woodward, DOB [birth year], MRN [id number]  PCP:  Loyola Mast, MD  [facility] Cardiologist: Dr. Antoine Poche, MD/ Dr. Excell Seltzer, MD (ASD closure)  Referring MD: Loyola Mast, MD   Chief Complaint  Patient presents with   Follow-up    1 month s/p ASD closure   History of Present Illness:    Emily Woodward is a 57 y.o. female with a hx of ankylosing spondylitis, osteopenia, and newly diagnosed atrial septal defect s/p closure who is here today for one month follow up.   Emily Woodward was initially seen by cardiology due to worsening SOB. She underwent an exercise treadmill study which was found to be abnormal. Subsequent gated coronary CTA showed normal coronary arteries, however did show the presence of a secundum atrial septal defect along with dilatation of the right heart chambers. She was then referred for transesophageal echo which demonstrated an approximate 10 mm atrial septal defect with adequate tissue rims for transcatheter closure. She saw Dr. Excell Seltzer in consultation with a plan to proceed with closure.   She is now s/p successful transcatheter ASD closure using a 12 mm Amplatzer atrial septal occluder device under intracardiac echo and fluoroscopic guidance. Recommendations were for DAPT with ASA and Plavix through 6 months (12/28/22) and dental SBE coverage over that time. Post procedure limited echo showed normal LVEF at 60-65% with a well seated Amplatzer septal occluder with no residual shunting by color doppler.    Today she is here with her husband and reports that her SOB has essentially been unchanged from prior to implant. She continues to struggle with exertional SOB at times and feels she is not back to her baseline. She has also been having intermittent palpitations with brief chest fluttering since her procedure.  Symptoms were more intense and frequent last week and have somewhat improved sine that time but are still present. Palpitations are not exacerbated by exercise or movement and can happen while sitting quietly. She has no LE edema, orthopnea, dizziness, pre syncope, or bleeding in stool or urine. She has been tolerating ASA and Plavix with minimal bruising.    Past Medical History:  Diagnosis Date   Ankylosing spondylitis (HCC)    Osteopenia     Past Surgical History:  Procedure Laterality Date   ABDOMINAL HYSTERECTOMY     ATRIAL SEPTAL DEFECT(ASD) CLOSURE N/A 06/28/2022   Procedure: ATRIAL SEPTAL DEFECT(ASD) CLOSURE;  Surgeon: Tonny Bollman, MD;  Location: [facility] INVASIVE CV LAB;  Service: Cardiovascular;  Laterality: N/A;   TEE WITHOUT CARDIOVERSION N/A 06/02/2022   Procedure: TRANSESOPHAGEAL ECHOCARDIOGRAM (TEE);  Surgeon: Sande Rives, MD;  Location: [facility] ENDOSCOPY;  Service: Cardiovascular;  Laterality: N/A;    Current Medications: Current Meds  Medication Sig   aspirin EC 81 MG tablet Take 1 tablet (81 mg total) by mouth daily. Swallow whole.   calcium carbonate (OS-CAL - DOSED IN MG OF ELEMENTAL CALCIUM) 1250 (500 Ca) MG tablet Take 1 tablet by mouth in the morning.   clopidogrel (PLAVIX) 75 MG tablet Starting 5 days prior to procedure, take 1 tablet by mouth daily   estradiol (ESTRACE) 0.5 MG tablet Take 1 tablet (0.5 mg total)  by mouth daily.   lidocaine (XYLOCAINE) 2 % solution Use as directed 15 mLs in the mouth or throat every 3 (three) hours as needed for mouth pain.   meloxicam (MOBIC) 15 MG tablet Take 15 mg by mouth daily as needed for pain.   metoprolol tartrate (LOPRESSOR) 100 MG tablet Take 2 hours prior to CT scan   Multiple Vitamin (MULTIVITAMIN) tablet Take 1 tablet by mouth every other day. In the morning   polyethylene glycol (MIRALAX / GLYCOLAX) 17 g packet Take 17 g by mouth daily as needed for moderate constipation.   risedronate (ACTONEL) 150 MG tablet Take  1 tablet (150 mg total) by mouth every 30 (thirty) days.     Allergies:   Patient has no known allergies.   Social History   Socioeconomic History   Marital status: Married    Spouse name: Not on file   Number of children: 1   Years of education: Not on file   Highest education level: Not on file  Occupational History   Occupation: Retired- Surveyor, minerals  Tobacco Use   Smoking status: Never   Smokeless tobacco: Never  Vaping Use   Vaping Use: Never used  Substance and Sexual Activity   Alcohol use: Yes    Comment: socailly   Drug use: Never   Sexual activity: Yes  Other Topics Concern   Not on file  Social History Narrative   Lives with husband   Social Determinants of Health   Financial Resource Strain: Not on file  Food Insecurity: Not on file  Transportation Needs: Not on file  Physical Activity: Not on file  Stress: Not on file  Social Connections: Not on file    Family History: The patient's family history includes Ankylosing spondylitis in her brother, father, and sister; Arthritis in her mother; Cancer in her sister; Diabetes in her father; Hyperlipidemia in her mother; Osteoporosis in her mother.  ROS:   Please see the history of present illness.    All other systems reviewed and are negative.  EKGs/Labs/Other Studies Reviewed:    The following studies were reviewed today:  ASD closure 06/28/22:  Successful transcatheter ASD closure using a 12 mm Amplatzer atrial septal occluder device under intracardiac echo and fluoroscopic guidance   Recommendations: DAPT with aspirin and clopidogrel through 6 months Limited 2D echocardiogram this afternoon prior to discharge SBE prophylaxis when indicated through 6 months Same-day discharge if criteria met   Limited echo 06/28/22:   1. Left ventricular ejection fraction, by estimation, is 60 to 65%. The  left ventricle has normal function.   2. Right ventricular systolic function is normal. The right  ventricular  size is mildly enlarged.   3. There is a well seated Amplatzer septal occluder device without  residual shunt by color Doppler.   4. Right atrial size was mildly dilated.   5. The mitral valve is normal in structure. No evidence of mitral valve  regurgitation. No evidence of mitral stenosis.   6. The aortic valve is tricuspid. Aortic valve regurgitation is not  visualized. No aortic stenosis is present.   EKG:  EKG is not ordered today.   Recent Labs: 06/17/2022: BUN 17; Creatinine, Ser 0.64; Hemoglobin 13.5; Platelets 415; Sodium 139 06/20/2022: Potassium 5.5   Recent Lipid Panel    Component Value Date/Time   CHOL 190 04/18/2022 0905   TRIG 49.0 04/18/2022 0905   HDL 82.00 04/18/2022 0905   CHOLHDL 2 04/18/2022 0905   VLDL 9.8 04/18/2022 0905  LDLCALC 98 04/18/2022 0905   Physical Exam:    VS:  BP 118/62   Pulse 84   Ht 5\' 2"  (1.575 m)   Wt 129 lb (58.5 kg)   SpO2 99%   BMI 23.59 kg/m     Wt Readings from Last 3 Encounters:  08/03/22 129 lb (58.5 kg)  06/28/22 125 lb (56.7 kg)  06/17/22 128 lb 9.6 oz (58.3 kg)    General: Well developed, well nourished, NAD Lungs:Clear to ausculation bilaterally. No wheezes, rales, or rhonchi. Breathing is unlabored. Cardiovascular: RRR with S1 S2. No murmurs Extremities: No edema.  Neuro: Alert and oriented. No focal deficits. No facial asymmetry. MAE spontaneously. Psych: Responds to questions appropriately with normal affect.    ASSESSMENT/PLAN:    ASD: s/p ASD repair with 12mm Amplatzer atrial septal occluder device under intracardiac echo and fluoroscopic guidance. Post procedure limited echo showed normal LVEF at 60-65% with a well seated Amplatzer septal occluder with no residual shunting by color doppler. Reports persistent, unchanged SOB since implant on today's evaluation. Will update limited echo with bubble tomorrow morning and follow results. May be deconditioning given well seated device on post implant  echo. Continue DAPT with ASA and Plavix through 6 months (12/28/22). Dental SBE discussed and Amoxicillin sent to preferred pharmacy. She will need this for 6 months only. Plan 4-5 week follow up with myself.   Palpitations: Reports intermittent palpitations since ASD closure. Symptoms have improved some since last week however remain problematic for her. No associated symptoms. Plan 2 week ZIO with 4-5 week follow up with myself.    Medication Adjustments/Labs and Tests Ordered: Current medicines are reviewed at length with the patient today.  Concerns regarding medicines are outlined above.  Orders Placed This Encounter  Procedures   LONG TERM MONITOR (3-14 DAYS)   No orders of the defined types were placed in this encounter.   Patient Instructions  Medication Instructions:  Your physician recommends that you continue on your current medications as directed. Please refer to the Current Medication list given to you today.  *If you need a refill on your cardiac medications before your next appointment, please call your pharmacy*   Lab Work: NONE If you have labs (blood work) drawn today and your tests are completely normal, you will receive your results only by: MyChart Message (if you have MyChart) OR A paper copy in the mail If you have any lab test that is abnormal or we need to change your treatment, we will call you to review the results.   Testing/Procedures: Christena Deem- Long Term Monitor Instructions  Your physician has requested you wear a ZIO patch monitor for 14 days.  This is a single patch monitor. [employer] supplies one patch monitor per enrollment. Additional stickers are not available. Please do not apply patch if you will be having a Nuclear Stress Test,  Echocardiogram, Cardiac CT, MRI, or Chest Xray during the period you would be wearing the  monitor. The patch cannot be worn during these tests. You cannot remove and re-apply the  ZIO XT patch monitor.  Your ZIO patch  monitor will be mailed 3 day USPS to your address on file. It may take 3-5 days  to receive your monitor after you have been enrolled.  Once you have received your monitor, please review the enclosed instructions. Your monitor  has already been registered assigning a specific monitor serial # to you.  Billing and Patient Assistance Program Information  We have supplied [employer]  with any of your insurance information on file for billing purposes. [employer] offers a sliding scale Patient Assistance Program for patients that do not have  insurance, or whose insurance does not completely cover the cost of the ZIO monitor.  You must apply for the Patient Assistance Program to qualify for this discounted rate.  To apply, please call [employer] at [phone], select option 4, select option 2, ask to apply for  Patient Assistance Program. Meredeth Ide will ask your household income, and how many people  are in your household. They will quote your out-of-pocket cost based on that information.  [employer] will also be able to set up a 48-month, interest-free payment plan if needed.   Follow-Up: At [facility], you and your health needs are our priority.  As part of our continuing mission to provide you with exceptional heart care, we have created designated Provider Care Teams.  These Care Teams include your primary Cardiologist (physician) and Advanced Practice Providers (APPs -  Physician Assistants and Nurse Practitioners) who all work together to provide you with the care you need, when you need it.  We recommend signing up for the patient portal called "MyChart".  Sign up information is provided on this After Visit Summary.  MyChart is used to connect with patients for Virtual Visits (Telemedicine).  Patients are able to view lab/test results, encounter notes, upcoming appointments, etc.  Non-urgent messages can be sent to your provider as well.   To learn more about what you can do with MyChart, go to  [email or web address].    Your next appointment:   KEEP SCHEDULED FOLLOW-UP   Signed, Georgie Chard, NP  08/03/2022 12:49 PM    [facility]

## 2022-08-02 ENCOUNTER — Other Ambulatory Visit: Payer: 59

## 2022-08-03 ENCOUNTER — Ambulatory Visit (INDEPENDENT_AMBULATORY_CARE_PROVIDER_SITE_OTHER): Payer: 59

## 2022-08-03 ENCOUNTER — Other Ambulatory Visit: Payer: Self-pay | Admitting: Cardiology

## 2022-08-03 ENCOUNTER — Ambulatory Visit: Payer: 59 | Attending: Cardiology | Admitting: Cardiology

## 2022-08-03 VITALS — BP 118/62 | HR 84 | Ht 62.0 in | Wt 129.0 lb

## 2022-08-03 DIAGNOSIS — R002 Palpitations: Secondary | ICD-10-CM

## 2022-08-03 DIAGNOSIS — Q211 Atrial septal defect, unspecified: Secondary | ICD-10-CM

## 2022-08-03 DIAGNOSIS — R0602 Shortness of breath: Secondary | ICD-10-CM | POA: Diagnosis not present

## 2022-08-03 MED ORDER — AMOXICILLIN 500 MG PO CAPS: 2000.0000 mg | ORAL_CAPSULE | ORAL | 0 refills | Status: DC

## 2022-08-03 NOTE — Patient Instructions (Signed)
Medication Instructions:  Your physician recommends that you continue on your current medications as directed. Please refer to the Current Medication list given to you today.  *If you need a refill on your cardiac medications before your next appointment, please call your pharmacy*   Lab Work: NONE If you have labs (blood work) drawn today and your tests are completely normal, you will receive your results only by: MyChart Message (if you have MyChart) OR A paper copy in the mail If you have any lab test that is abnormal or we need to change your treatment, we will call you to review the results.   Testing/Procedures: Christena Deem- Long Term Monitor Instructions  Your physician has requested you wear a ZIO patch monitor for 14 days.  This is a single patch monitor. [employer] supplies one patch monitor per enrollment. Additional stickers are not available. Please do not apply patch if you will be having a Nuclear Stress Test,  Echocardiogram, Cardiac CT, MRI, or Chest Xray during the period you would be wearing the  monitor. The patch cannot be worn during these tests. You cannot remove and re-apply the  ZIO XT patch monitor.  Your ZIO patch monitor will be mailed 3 day USPS to your address on file. It may take 3-5 days  to receive your monitor after you have been enrolled.  Once you have received your monitor, please review the enclosed instructions. Your monitor  has already been registered assigning a specific monitor serial # to you.  Billing and Patient Assistance Program Information  We have supplied [employer] with any of your insurance information on file for billing purposes. [employer] offers a sliding scale Patient Assistance Program for patients that do not have  insurance, or whose insurance does not completely cover the cost of the ZIO monitor.  You must apply for the Patient Assistance Program to qualify for this discounted rate.  To apply, please call [employer] at [phone], select  option 4, select option 2, ask to apply for  Patient Assistance Program. Meredeth Ide will ask your household income, and how many people  are in your household. They will quote your out-of-pocket cost based on that information.  [employer] will also be able to set up a 42-month, interest-free payment plan if needed.   Follow-Up: At [facility], you and your health needs are our priority.  As part of our continuing mission to provide you with exceptional heart care, we have created designated Provider Care Teams.  These Care Teams include your primary Cardiologist (physician) and Advanced Practice Providers (APPs -  Physician Assistants and Nurse Practitioners) who all work together to provide you with the care you need, when you need it.  We recommend signing up for the patient portal called "MyChart".  Sign up information is provided on this After Visit Summary.  MyChart is used to connect with patients for Virtual Visits (Telemedicine).  Patients are able to view lab/test results, encounter notes, upcoming appointments, etc.  Non-urgent messages can be sent to your provider as well.   To learn more about what you can do with MyChart, go to [email or web address].    Your next appointment:   KEEP SCHEDULED FOLLOW-UP

## 2022-08-03 NOTE — Progress Notes (Unsigned)
Applied a 14 day Zio XT monitor to patient in the office  Hochrein to read

## 2022-08-04 ENCOUNTER — Ambulatory Visit (HOSPITAL_COMMUNITY): Payer: 59 | Attending: Cardiology

## 2022-08-04 DIAGNOSIS — Q211 Atrial septal defect, unspecified: Secondary | ICD-10-CM | POA: Insufficient documentation

## 2022-08-04 LAB — ECHOCARDIOGRAM LIMITED BUBBLE STUDY
Area-P 1/2: 5.27 cm2
MV M vel: 4.11 m/s
MV Peak grad: 67.6 mmHg
S' Lateral: 2.2 cm

## 2022-08-05 ENCOUNTER — Ambulatory Visit
Admission: RE | Admit: 2022-08-05 | Discharge: 2022-08-05 | Disposition: A | Discharge status: Home / Self Care | Payer: 59 | Source: Ambulatory Visit | Attending: Family Medicine | Admitting: Family Medicine

## 2022-08-05 ENCOUNTER — Other Ambulatory Visit: Payer: Self-pay | Admitting: Family Medicine

## 2022-08-05 DIAGNOSIS — R928 Other abnormal and inconclusive findings on diagnostic imaging of breast: Secondary | ICD-10-CM

## 2022-08-11 ENCOUNTER — Other Ambulatory Visit: Payer: 59

## 2022-08-18 ENCOUNTER — Ambulatory Visit (INDEPENDENT_AMBULATORY_CARE_PROVIDER_SITE_OTHER): Payer: 59 | Admitting: Family Medicine

## 2022-08-18 VITALS — BP 116/68 | HR 87 | Temp 98.1°F | Ht 62.0 in | Wt 129.0 lb

## 2022-08-18 DIAGNOSIS — R140 Abdominal distension (gaseous): Secondary | ICD-10-CM

## 2022-08-18 DIAGNOSIS — Z8774 Personal history of (corrected) congenital malformations of heart and circulatory system: Secondary | ICD-10-CM | POA: Diagnosis not present

## 2022-08-18 DIAGNOSIS — R002 Palpitations: Secondary | ICD-10-CM

## 2022-08-18 LAB — COMPREHENSIVE METABOLIC PANEL
ALT: 12 U/L (ref 0–35)
AST: 15 U/L (ref 0–37)
Albumin: 4.1 g/dL (ref 3.5–5.2)
Alkaline Phosphatase: 55 U/L (ref 39–117)
BUN: 10 mg/dL (ref 6–23)
CO2: 28 mEq/L (ref 19–32)
Calcium: 9.2 mg/dL (ref 8.4–10.5)
Chloride: 100 mEq/L (ref 96–112)
Creatinine, Ser: 0.6 mg/dL (ref 0.40–1.20)
GFR: 99.89 mL/min (ref >60.00)
Glucose, Bld: 98 mg/dL (ref 70–99)
Potassium: 4.7 mEq/L (ref 3.5–5.1)
Sodium: 135 mEq/L (ref 135–145)
Total Bilirubin: 0.3 mg/dL (ref 0.2–1.2)
Total Protein: 6.7 g/dL (ref 6.0–8.3)

## 2022-08-18 LAB — CBC
HCT: 39.4 % (ref 36.0–46.0)
Hemoglobin: 12.7 g/dL (ref 12.0–15.0)
MCHC: 32.3 g/dL (ref 30.0–36.0)
MCV: 95.4 fl (ref 78.0–100.0)
Platelets: 353 10*3/uL (ref 150.0–400.0)
RBC: 4.13 Mil/uL (ref 3.87–5.11)
RDW: 13 % (ref 11.5–15.5)
WBC: 4.1 10*3/uL (ref 4.0–10.5)

## 2022-08-18 LAB — LIPASE: Lipase: 15 U/L (ref 11.0–59.0)

## 2022-08-18 NOTE — Assessment & Plan Note (Signed)
This may represent an IBS picture, though Emily Woodward has not been able to identify any particular triggers. I will check some labs for common causes of upper abdominal discomfort. I will check a CA-125 due to her concerns for ovarian cancer. I will check an LH/FSH to see if we can confirm menopause. I will refer her to GI to consider an EGD to evaluate.

## 2022-08-18 NOTE — Assessment & Plan Note (Signed)
Closure complete for her ASD

## 2022-08-18 NOTE — Progress Notes (Signed)
[facility] [address] [place] [address] Dept: [phone] Dept Fax: [phone]  Office Visit  Subjective:    Patient ID: Emily Woodward, female    DOB: 04-12-1965, 57 y.o..   MRN: [id number]  Chief Complaint  Patient presents with   GI Problem    C/o having some GI discomfort every month.  Gas-X, tums, Pepcid AC   History of Present Illness:  Patient is in today complaining of ongoing GI issues. At her annual PE in Feb., she noted episodic issues with a burning sensation in her epigastrium. She can also feel some bloating. She has been monitoring this and feels it does occur for about 1-2 days about a month apart. She has tried using Protonix, Pepcid AC, and various antacids without much relief in the short-term. She has some concerns about the monthly pattern and could this relate to an ovarian issue.  After her last visit, I referred Ms. Reveles to cardiology related to complaints of DOE. She was found to have a atrial septal defect. She underwent catheter closure of this. Since her surgery, she had been having some episodic palpitations that also seemed to be causing her DOE. She recently finished a two week period of wearing a Zio patch and will be turning this back in. she does note her palpitations seem to have resolved over the past 2 days.  Past Medical History: Patient Active Problem List   Diagnosis Date Noted   H/O catheter-based closure of atrial septal defect 08/18/2022   Dyspnea on exertion 04/15/2022   Impingement of right shoulder 04/15/2022   Primary osteoarthritis of both knees 04/15/2022   History of retinal detachment- OS 04/15/2022   History of infection due to ESBL Escherichia coli 08/23/2021   Fracture of scaphoid bone of wrist 07/28/2021   Acquired trigger finger 07/28/2021   Ankylosing spondylitis (HCC) 04/12/2021   Osteopenia 04/12/2021   Lichen planus 04/12/2021   GERD (gastroesophageal reflux  disease) 04/12/2021   Hyperkalemia 04/12/2021   Chronic constipation 04/12/2021   Past Surgical History:  Procedure Laterality Date   ABDOMINAL HYSTERECTOMY     ATRIAL SEPTAL DEFECT(ASD) CLOSURE N/A 06/28/2022   Procedure: ATRIAL SEPTAL DEFECT(ASD) CLOSURE;  Surgeon: Tonny Bollman, MD;  Location: [facility] INVASIVE CV LAB;  Service: Cardiovascular;  Laterality: N/A;   TEE WITHOUT CARDIOVERSION N/A 06/02/2022   Procedure: TRANSESOPHAGEAL ECHOCARDIOGRAM (TEE);  Surgeon: Sande Rives, MD;  Location: [facility] ENDOSCOPY;  Service: Cardiovascular;  Laterality: N/A;   Family History  Problem Relation Age of Onset   Arthritis Mother    Hyperlipidemia Mother    Osteoporosis Mother    Ankylosing spondylitis Father    Diabetes Father    Ankylosing spondylitis Sister    Cancer Sister        ovarian   Ankylosing spondylitis Brother    Outpatient Medications Prior to Visit  Medication Sig Dispense Refill   amoxicillin (AMOXIL) 500 MG capsule Take 4 capsules (2,000 mg total) by mouth as directed. Take 4 tablets 1 hour prior to dental work, including cleanings. 4 capsule 0   aspirin EC 81 MG tablet Take 1 tablet (81 mg total) by mouth daily. Swallow whole. 90 tablet 3   calcium carbonate (OS-CAL - DOSED IN MG OF ELEMENTAL CALCIUM) 1250 (500 Ca) MG tablet Take 1 tablet by mouth in the morning.     clopidogrel (PLAVIX) 75 MG tablet Starting 5 days prior to procedure, take 1 tablet by mouth daily 90 tablet 1  estradiol (ESTRACE) 0.5 MG tablet Take 1 tablet (0.5 mg total) by mouth daily. 90 tablet 3   lidocaine (XYLOCAINE) 2 % solution Use as directed 15 mLs in the mouth or throat every 3 (three) hours as needed for mouth pain.     meloxicam (MOBIC) 15 MG tablet Take 15 mg by mouth daily as needed for pain.     metoprolol tartrate (LOPRESSOR) 100 MG tablet Take 2 hours prior to CT scan 1 tablet 0   Multiple Vitamin (MULTIVITAMIN) tablet Take 1 tablet by mouth every other day. In the morning      polyethylene glycol (MIRALAX / GLYCOLAX) 17 g packet Take 17 g by mouth daily as needed for moderate constipation.     risedronate (ACTONEL) 150 MG tablet Take 1 tablet (150 mg total) by mouth every 30 (thirty) days. 3 tablet 1   No facility-administered medications prior to visit.   No Known Allergies   Objective:   Today's Vitals   08/18/22 1006  BP: 116/68  Pulse: 87  Temp: 98.1 F (36.7 C)  TempSrc: Temporal  SpO2: 99%  Weight: 129 lb (58.5 kg)  Height: 5\' 2"  (1.575 m)   Body mass index is 23.59 kg/m.   General: Well developed, well nourished. No acute distress. Abdomen: Soft, non-tender. Bowel sounds positive, normal pitch and frequency. No hepatosplenomegaly. No rebound or guarding. Psych: Alert and oriented. Normal mood and affect.  Health Maintenance Due  Topic Date Due   HIV Screening  Never done     Assessment & Plan:   Problem List Items Addressed This Visit       Other   H/O catheter-based closure of atrial septal defect    Closure complete for her ASD      Bloating- Cyclic - Primary    This may represent an IBS picture, though Ms. Ovens has not been able to identify any particular triggers. I will check some labs for common causes of upper abdominal discomfort. I will check a CA-125 due to her concerns for ovarian cancer. I will check an LH/FSH to see if we can confirm menopause. I will refer her to GI to consider an EGD to evaluate.      Relevant Orders   FSH/LH   CA 125   CBC (Completed)   Comprehensive metabolic panel (Completed)   Lipase (Completed)   Celiac Panel   Ambulatory referral to Gastroenterology   Palpitations    Improved. I do think she should turn in the Zio patch for assessment.       Return if symptoms worsen or fail to improve.   Loyola Mast, MD

## 2022-08-18 NOTE — Assessment & Plan Note (Signed)
Improved. I do think she should turn in the Zio patch for assessment.

## 2022-08-19 LAB — CA 125: CA 125: 11 U/mL (ref <35)

## 2022-08-19 LAB — FSH/LH
FSH: 25.7 m[IU]/mL
LH: 10.9 m[IU]/mL

## 2022-08-20 LAB — GLIA (IGA/G) + TTG IGA
Antigliadin Abs, IgA: 3 units (ref 0–19)
Gliadin IgG: 3 units (ref 0–19)
Transglutaminase IgA: <2 U/mL (ref 0–3)

## 2022-08-26 ENCOUNTER — Encounter: Payer: Self-pay | Admitting: Gastroenterology

## 2022-09-06 NOTE — Progress Notes (Unsigned)
[facility]   MULTIDISCIPLINARY HEART VALVE CLINIC                                     Cardiology Office Note:    Date:  09/07/2022   ID:  Emily Woodward, DOB 01-14-1966, MRN [id number]  PCP:  Loyola Mast, MD  [facility] Cardiologist:  Dr. Excell Seltzer, MD   Referring MD: Loyola Mast, MD   Chief Complaint  Patient presents with   Follow-up   History of Present Illness:    Emily Woodward is a 57 y.o. female with a hx of ankylosing spondylitis, osteopenia, and newly diagnosed atrial septal defect s/p closure who is here today for ZIO monitor follow up.    Emily Woodward was initially seen by cardiology due to worsening SOB. She underwent an exercise treadmill study which was found to be abnormal. Subsequent gated coronary CTA showed normal coronary arteries, however did show the presence of a secundum atrial septal defect along with dilatation of the right heart chambers. She was then referred for transesophageal echo which demonstrated an approximate 10 mm atrial septal defect with adequate tissue rims for transcatheter closure. She saw Dr. Excell Seltzer in consultation with a plan to proceed with closure.    She is now s/p successful transcatheter ASD closure using a 12 mm Amplatzer atrial septal occluder device under intracardiac echo and fluoroscopic guidance. Recommendations were for DAPT with ASA and Plavix through 6 months (12/28/22) and dental SBE coverage over that time. Post procedure limited echo showed normal LVEF at 60-65% with a well seated Amplatzer septal occluder with no residual shunting by color doppler.     In follow up, she reports recent palpitations. ZIO monitor placed which showed no sustained or acute arrhythmias with a rare instance of a fast HR lasting 15 beats however not felt to be the cause of her symptoms.   Today she is here alone and states that she no longer has palpitations. She denies chest pain, SOB, LE edema, orthopnea, PND, dizziness, or syncope.  Denies bleeding in stool or urine.    Past Medical History:  Diagnosis Date   Ankylosing spondylitis (HCC)    Osteopenia     Past Surgical History:  Procedure Laterality Date   ABDOMINAL HYSTERECTOMY     ATRIAL SEPTAL DEFECT(ASD) CLOSURE N/A 06/28/2022   Procedure: ATRIAL SEPTAL DEFECT(ASD) CLOSURE;  Surgeon: Tonny Bollman, MD;  Location: [facility] INVASIVE CV LAB;  Service: Cardiovascular;  Laterality: N/A;   TEE WITHOUT CARDIOVERSION N/A 06/02/2022   Procedure: TRANSESOPHAGEAL ECHOCARDIOGRAM (TEE);  Surgeon: Sande Rives, MD;  Location: [facility] ENDOSCOPY;  Service: Cardiovascular;  Laterality: N/A;   Current Medications: Current Meds  Medication Sig   amoxicillin (AMOXIL) 500 MG capsule Take 4 capsules (2,000 mg total) by mouth as directed. Take 4 tablets 1 hour prior to dental work, including cleanings.   aspirin EC 81 MG tablet Take 1 tablet (81 mg total) by mouth daily. Swallow whole.   calcium carbonate (OS-CAL - DOSED IN MG OF ELEMENTAL CALCIUM) 1250 (500 Ca) MG tablet Take 1 tablet by mouth in the morning.   clopidogrel (PLAVIX) 75 MG tablet Starting 5 days prior to procedure, take 1 tablet by mouth daily   estradiol (ESTRACE) 0.5 MG tablet Take 1 tablet (0.5 mg total) by mouth daily.   lidocaine (XYLOCAINE) 2 % solution Use as directed 15 mLs in the mouth or throat  every 3 (three) hours as needed for mouth pain.   meloxicam (MOBIC) 15 MG tablet Take 15 mg by mouth daily as needed for pain.   Multiple Vitamin (MULTIVITAMIN) tablet Take 1 tablet by mouth every other day. In the morning   polyethylene glycol (MIRALAX / GLYCOLAX) 17 g packet Take 17 g by mouth daily as needed for moderate constipation.   risedronate (ACTONEL) 150 MG tablet Take 1 tablet (150 mg total) by mouth every 30 (thirty) days.     Allergies:   Patient has no known allergies.   Social History   Socioeconomic History   Marital status: Married    Spouse name: Not on file   Number of children: 1   Years of  education: Not on file   Highest education level: Master's degree (e.g., MA, MS, MEng, MEd, MSW, MBA)  Occupational History   Occupation: Retired- Surveyor, minerals  Tobacco Use   Smoking status: Never   Smokeless tobacco: Never  Vaping Use   Vaping Use: Never used  Substance and Sexual Activity   Alcohol use: Yes    Comment: socailly   Drug use: Never   Sexual activity: Yes  Other Topics Concern   Not on file  Social History Narrative   Lives with husband   Social Determinants of Health   Financial Resource Strain: Low Risk  (08/18/2022)   Overall Financial Resource Strain (CARDIA)    Difficulty of Paying Living Expenses: Not hard at all  Food Insecurity: Unknown (08/18/2022)   Hunger Vital Sign    Worried About Running Out of Food in the Last Year: Never true    Ran Out of Food in the Last Year: Patient declined  Transportation Needs: Patient Declined (08/18/2022)   PRAPARE - Administrator, Civil Service (Medical): Patient declined    Lack of Transportation (Non-Medical): Patient declined  Physical Activity: Sufficiently Active (08/18/2022)   Exercise Vital Sign    Days of Exercise per Week: 5 days    Minutes of Exercise per Session: 50 min  Stress: No Stress Concern Present (08/18/2022)   [employer] of Occupational Health - Occupational Stress Questionnaire    Feeling of Stress : Not at all  Social Connections: Unknown (08/18/2022)   Social Connection and Isolation Panel [NHANES]    Frequency of Communication with Friends and Family: More than three times a week    Frequency of Social Gatherings with Friends and Family: Once a week    Attends Religious Services: Patient declined    Database administrator or Organizations: No    Attends Engineer, structural: Not on file    Marital Status: Married    Family History: The patient's family history includes Ankylosing spondylitis in her brother, father, and sister; Arthritis in her mother; Cancer in  her sister; Diabetes in her father; Hyperlipidemia in her mother; Osteoporosis in her mother.  ROS:   Please see the history of present illness.    All other systems reviewed and are negative.  EKGs/Labs/Other Studies Reviewed:    The following studies were reviewed today:  Cardiac Studies & Procedures   CARDIAC CATHETERIZATION  CARDIAC CATHETERIZATION 06/28/2022  Narrative Successful transcatheter ASD closure using a 12 mm Amplatzer atrial septal occluder device under intracardiac echo and fluoroscopic guidance  Recommendations: DAPT with aspirin and clopidogrel through 6 months Limited 2D echocardiogram this afternoon prior to discharge SBE prophylaxis when indicated through 6 months Same-day discharge if criteria met   STRESS TESTS  EXERCISE  TOLERANCE TEST (ETT) 04/26/2022  Narrative   Patient exercised for 10:35min achieving 11.8 METs   Target HR was acheived (Max HR 179bpm; 109% MPHR)   There were 2mm up sloping ST depression in the inferolateral leads (II, III, aVF, V5 and V6) during stress that resolved about into recovery.   The study is borderline for ischemia given upsloping ST depressions in the inferolateral leads during stress that persisted into recovery. Recommend further ischemic evaluation with imaging.  Laurance Flatten, MD   ECHOCARDIOGRAM  ECHOCARDIOGRAM LIMITED BUBBLE STUDY 08/04/2022  Narrative ECHOCARDIOGRAM LIMITED REPORT    Patient Name:   Emily Woodward  Date of Exam: 08/04/2022 Medical Rec #:  [id number]     Height:       62.0 in Accession #:    [id number]    Weight:       129.0 lb Date of Birth:  1965-06-10     BSA:          1.587 m Patient Age:    57 years      BP:           118/62 mmHg Patient Gender: F             HR:           58 bpm. Exam Location:  [address]  Procedure: Limited Echo, Cardiac Doppler, Limited Color Doppler and Saline Contrast Bubble Study  Indications:    Q21.1 ASD  History:        Patient has prior history  of Echocardiogram examinations, most recent 06/28/2022. Dyspnea on exertion. Palpitations. ASD closure (Amplatzer device).  Sonographer:    Cathie Beams RCS Referring Phys: [id number] Savyon Loken D Jiovani Mccammon  IMPRESSIONS   1. Left ventricular ejection fraction, by estimation, is 55 to 60%. The left ventricle has normal function. The left ventricle has no regional wall motion abnormalities. Left ventricular diastolic parameters were normal. 2. Right ventricular systolic function is normal. The right ventricular size is normal. There is normal pulmonary artery systolic pressure. The estimated right ventricular systolic pressure is 13.9 mmHg. 3. Amplatzer ASD closure device present, no obvious leak. 4. Bubble study negative. 5. The mitral valve is normal in structure. Mild mitral valve regurgitation. No evidence of mitral stenosis. 6. The aortic valve is tricuspid. Aortic valve regurgitation is not visualized. No aortic stenosis is present. 7. The inferior vena cava is normal in size with greater than 50% respiratory variability, suggesting right atrial pressure of 3 mmHg.  FINDINGS Left Ventricle: Left ventricular ejection fraction, by estimation, is 55 to 60%. The left ventricle has normal function. The left ventricle has no regional wall motion abnormalities. The left ventricular internal cavity size was normal in size. There is no left ventricular hypertrophy. Left ventricular diastolic parameters were normal.  Right Ventricle: The right ventricular size is normal. No increase in right ventricular wall thickness. Right ventricular systolic function is normal. There is normal pulmonary artery systolic pressure. The tricuspid regurgitant velocity is 1.65 m/s, and with an assumed right atrial pressure of 3 mmHg, the estimated right ventricular systolic pressure is 13.9 mmHg.  Left Atrium: Amplatzer ASD closure device present, no obvious leak.  Mitral Valve: The mitral valve is normal in structure. Mild  mitral valve regurgitation. No evidence of mitral valve stenosis.  Tricuspid Valve: The tricuspid valve is normal in structure. Tricuspid valve regurgitation is trivial.  Aortic Valve: The aortic valve is tricuspid. Aortic valve regurgitation is not visualized. No aortic stenosis is present.  Pulmonic Valve:  The pulmonic valve was normal in structure. Pulmonic valve regurgitation is not visualized.  Aorta: The aortic root is normal in size and structure.  Venous: The inferior vena cava is normal in size with greater than 50% respiratory variability, suggesting right atrial pressure of 3 mmHg.  IAS/Shunts: Bubble study negative. Agitated saline contrast was given intravenously to evaluate for intracardiac shunting.  LEFT VENTRICLE PLAX 2D LVIDd:         3.70 cm   Diastology LVIDs:         2.20 cm   LV e' medial:    10.90 cm/s LV PW:         0.80 cm   LV E/e' medial:  7.3 LV IVS:        1.00 cm   LV e' lateral:   14.40 cm/s LVOT diam:     1.90 cm   LV E/e' lateral: 5.5 LV SV:         42 LV SV Index:   26 LVOT Area:     2.84 cm   RIGHT VENTRICLE RV S prime:     12.80 cm/s RVSP:           13.9 mmHg  LEFT ATRIUM         Index       RIGHT ATRIUM LA diam:    3.30 cm 2.08 cm/m  RA Pressure: 3.00 mmHg AORTIC VALVE LVOT Vmax:   63.90 cm/s LVOT Vmean:  43.300 cm/s LVOT VTI:    0.147 m  AORTA Ao Root diam: 2.70 cm Ao Asc diam:  3.10 cm  MITRAL VALVE               TRICUSPID VALVE MV Area (PHT): 5.27 cm    TR Peak grad:   10.9 mmHg MV Decel Time: 144 msec    TR Vmax:        165.00 cm/s MR Peak grad: 67.6 mmHg    Estimated RAP:  3.00 mmHg MR Mean grad: 38.0 mmHg    RVSP:           13.9 mmHg MR Vmax:      411.00 cm/s MR Vmean:     286.0 cm/s   SHUNTS MV E velocity: 79.70 cm/s  Systemic VTI:  0.15 m MV A velocity: 51.40 cm/s  Systemic Diam: 1.90 cm MV E/A ratio:  1.55  Dalton McleanMD Electronically signed by Wilfred Lacy Signature Date/Time: 08/04/2022/9:27:50  AM    Final   TEE  ECHO TEE 06/02/2022  Narrative TRANSESOPHOGEAL ECHO REPORT    Patient Name:   Emily Woodward Date of Exam: 06/02/2022 Medical Rec #:  [id number]    Height:       62.0 in Accession #:    [id number]   Weight:       129.4 lb Date of Birth:  15-Oct-1965    BSA:          1.589 m Patient Age:    56 years     BP:           103/82 mmHg Patient Gender: F            HR:           80 bpm. Exam Location:  Outpatient  Procedure: Transesophageal Echo, Color Doppler, Cardiac Doppler and 3D Echo  Indications:     atrial septal defect  History:         Patient has no prior history of Echocardiogram examinations.  Sonographer:  Delcie Roch RDCS Referring Phys:  [id number] Ronnald Ramp O'NEAL Diagnosing Phys: Lennie Odor MD  PROCEDURE: After discussion of the risks and benefits of a TEE, an informed consent was obtained from the patient. TEE procedure time was 17 minutes. The transesophogeal probe was passed without difficulty through the esophogus of the patient. Imaged were obtained with the patient in a left lateral decubitus position. Sedation performed by different physician. The patient was monitored while under deep sedation. Anesthestetic sedation was provided intravenously by Anesthesiology: 400mg  of Propofol, 40mg  of Lidocaine. Image quality was excellent. The patient's vital signs; including heart rate, blood pressure, and oxygen saturation; remained stable throughout the procedure. The patient developed no complications during the procedure.  IMPRESSIONS   1. Moderate sized secundum ASD with L to R shunting. QP:QS ~2.0. Mild dilation of the RV. Mildly dilated pulmonary artery ~31 mm. Superior rim 10 mm, inferior rim 22 mm, aortic rim 5.7 mm, posterior rim 15 mm. ASD measures 10 mm x 9 mm (0.75 cm2). 2. Left ventricular ejection fraction, by estimation, is 60 to 65%. The left ventricle has normal function. 3. Right ventricular systolic function is normal. The  right ventricular size is mildly enlarged. 4. No left atrial/left atrial appendage thrombus was detected. 5. The mitral valve is normal in structure. Trivial mitral valve regurgitation. No evidence of mitral stenosis. 6. The aortic valve is tricuspid. Aortic valve regurgitation is not visualized. No aortic stenosis is present. 7. Mildly dilated pulmonary artery. 8. Evidence of atrial level shunting detected by color flow Doppler. There is a moderately sized secundum atrial septal defect with predominantly left to right shunting across the atrial septum.  FINDINGS Left Ventricle: Left ventricular ejection fraction, by estimation, is 60 to 65%. The left ventricle has normal function. The left ventricular internal cavity size was normal in size. The ratio of pulmonic flow to systemic flow (Qp/Qs ratio) is 2.00.  Right Ventricle: The right ventricular size is mildly enlarged. No increase in right ventricular wall thickness. Right ventricular systolic function is normal.  Left Atrium: Left atrial size was normal in size. No left atrial/left atrial appendage thrombus was detected.  Right Atrium: Right atrial size was normal in size.  Pericardium: There is no evidence of pericardial effusion.  Mitral Valve: The mitral valve is normal in structure. Trivial mitral valve regurgitation. No evidence of mitral valve stenosis.  Tricuspid Valve: The tricuspid valve is grossly normal. Tricuspid valve regurgitation is trivial. No evidence of tricuspid stenosis.  Aortic Valve: The aortic valve is tricuspid. Aortic valve regurgitation is not visualized. No aortic stenosis is present.  Pulmonic Valve: The pulmonic valve was normal in structure. Pulmonic valve regurgitation is not visualized. No evidence of pulmonic stenosis.  Aorta: The aortic root and ascending aorta are structurally normal, with no evidence of dilitation.  Pulmonary Artery: The pulmonary artery is mildly dilated.  Venous: The left upper  pulmonary vein, left lower pulmonary vein, right lower pulmonary vein and right upper pulmonary vein are normal.  IAS/Shunts: Evidence of atrial level shunting detected by color flow Doppler. There is a moderately sized secundum atrial septal defect with predominantly left to right shunting across the atrial septum. The ratio of pulmonic flow to systemic flow (Qp/Qs ratio) is 2.00.   LEFT VENTRICLE PLAX 2D LVOT diam:     1.90 cm LV SV:         54 LV SV Index:   34 LVOT Area:     2.84 cm   RIGHT VENTRICLE RVOT diam:  2.90 cm  AORTIC VALVE             PULMONIC VALVE LVOT Vmax:   87.80 cm/s  RVOT Peak grad: 2 mmHg LVOT Vmean:  59.500 cm/s LVOT VTI:    0.192 m  AORTA Ao Root diam: 2.63 cm Ao Asc diam:  2.71 cm   SHUNTS Systemic VTI:  0.19 m Systemic Diam: 1.90 cm Pulmonic VTI:  0.165 m Pulmonic Diam: 2.90 cm Qp/Qs:         2.00  Lennie Odor MD Electronically signed by Lennie Odor MD Signature Date/Time: 06/02/2022/12:17:22 PM    Final   MONITORS  LONG TERM MONITOR (3-14 DAYS) 08/25/2022  Narrative Predominant rhythm was normal sinus. Rare episodes of supraventricular tachycardia noted with the longest being 15 beats. No sustained arrhythmias.   CT SCANS  CT CORONARY MORPH W/CTA COR W/SCORE 05/11/2022  Addendum 05/11/2022 10:26 PM ADDENDUM REPORT: 05/11/2022 22:24  EXAM: OVER-READ INTERPRETATION  CT CHEST  The following report is an over-read performed by radiologist Dr. Alcide Clever of [facility] on 05/11/2022. This over-read does not include interpretation of cardiac or coronary anatomy or pathology. The coronary calcium score/coronary CTA interpretation by the cardiologist is attached.  COMPARISON:  None.  FINDINGS: Cardiovascular: There are no significant extracardiac vascular findings.  Mediastinum/Nodes: There are no enlarged lymph nodes within the visualized mediastinum.  Lungs/Pleura: There is no pleural effusion. The  visualized lungs appear clear.  Upper abdomen: No significant findings in the visualized upper abdomen.  Musculoskeletal/Chest wall: No chest wall mass or suspicious osseous findings within the visualized chest.  IMPRESSION: No significant extracardiac findings within the visualized chest.   Electronically Signed By: Alcide Clever M.D. On: 05/11/2022 22:24  Narrative CLINICAL DATA:  57 year old avid runner with increased dyspnea on exertion.  EXAM: Cardiac/Coronary  CTA  TECHNIQUE: The patient was scanned on a [employer].  FINDINGS: A 100 kV prospective scan was triggered in the descending thoracic aorta at 111 HU's. Axial non-contrast 3 mm slices were carried out through the heart. The data set was analyzed on a dedicated work station and scored using the Agatson method. Gantry rotation speed was 250 msecs and collimation was .6 mm. 0.8 mg of sl NTG was given. The 3D data set was reconstructed in 5% intervals of the 67-82 % of the R-R cycle. Diastolic phases were analyzed on a dedicated work station using MPR, MIP and VRT modes. The patient received 80 cc of contrast.  Image quality: good  Aorta:  Normal size.  No calcifications.  No dissection.  Aortic Valve:  Trileaflet.  No calcifications.  Coronary Arteries:  Normal coronary origin.  Right dominance.  RCA is a large dominant artery that gives rise to PDA and PLA. There is no plaque.  Left main is a large artery that gives rise to LAD and LCX arteries.  LAD is a large vessel that has no plaque.  D1 is large caliber, no plaque  D2 is small caliber, no plaque  LCX is a non-dominant artery.  There is no plaque.  OM1 is large caliber, no plaque  OM2-4 are small and under filled.  Other findings:  Normal pulmonary vein drainage into the left atrium.  Normal left atrial appendage without a thrombus.  Normal size of the pulmonary artery.  Dilated RA (50 mm) and RV (45 mm).  Secundum  atrial septal defect (ASD) with left to right shunt measuring 9.6 x 9 mm in diameter, area 0.615 cm2.  Please see radiology report for non cardiac findings.  IMPRESSION: 1. Secundum atrial septal defect (ASD) with left to right shunt measuring 9.6 x 9 mm in diameter, area 0.615 cm2.  2. Normal coronary origin with right dominance.  3. No evidence of CAD.  4. Coronary calcium score of 0.  5. Dilated RA (50 mm) and RV (45 mm).  CAD-RADS 0.  No evidence of CAD (0%).  Electronically Signed: By: Donato Schultz M.D. On: 05/10/2022 14:37          EKG:  EKG is  ordered today.    Recent Labs: 08/18/2022: ALT 12; BUN 10; Creatinine, Ser 0.60; Hemoglobin 12.7; Platelets 353.0; Potassium 4.7; Sodium 135   Recent Lipid Panel    Component Value Date/Time   CHOL 190 04/18/2022 0905   TRIG 49.0 04/18/2022 0905   HDL 82.00 04/18/2022 0905   CHOLHDL 2 04/18/2022 0905   VLDL 9.8 04/18/2022 0905   LDLCALC 98 04/18/2022 0905   Physical Exam:    VS:  BP 92/64   Pulse 99   Ht 5\' 2"  (1.575 m)   Wt 126 lb 9.6 oz (57.4 kg)   SpO2 99%   BMI 23.16 kg/m     Wt Readings from Last 3 Encounters:  09/07/22 126 lb 9.6 oz (57.4 kg)  08/18/22 129 lb (58.5 kg)  08/03/22 129 lb (58.5 kg)    General: Well developed, well nourished, NAD Lungs:Clear to ausculation bilaterally. No wheezes, rales, or rhonchi. Breathing is unlabored. Cardiovascular: RRR with S1 S2. No murmurs Extremities: No edema.  Neuro: Alert and oriented. No focal deficits. No facial asymmetry. MAE spontaneously. Psych: Responds to questions appropriately with normal affect.    ASSESSMENT/PLAN:    ASD: s/p ASD repair with 12mm Amplatzer atrial septal occluder device under intracardiac echo and fluoroscopic guidance. Post procedure limited echo showed normal LVEF at 60-65% with a well seated Amplatzer septal occluder with no residual shunting by color doppler. Continue DAPT with ASA and Plavix through 6 months (12/28/22). Dental  SBE discussed and Amoxicillin through this time. Plan one year follow up with echo.   Palpitations: ZIO monitor with no acute arrhthymias noted. Reports no further palpitations since she was last seen. No changes today.   Medication Adjustments/Labs and Tests Ordered: Current medicines are reviewed at length with the patient today.  Concerns regarding medicines are outlined above.  No orders of the defined types were placed in this encounter.  No orders of the defined types were placed in this encounter.   Patient Instructions  Medication Instructions:  Your physician has recommended you make the following change in your medication:  STOP ASPIRIN, AMOXICILLIN, AND PLAVIX ON 12/28/22  *If you need a refill on your cardiac medications before your next appointment, please call your pharmacy*   Lab Work: NONE If you have labs (blood work) drawn today and your tests are completely normal, you will receive your results only by: MyChart Message (if you have MyChart) OR A paper copy in the mail If you have any lab test that is abnormal or we need to change your treatment, we will call you to review the results.   Testing/Procedures: NONE   Follow-Up: At [facility], you and your health needs are our priority.  As part of our continuing mission to provide you with exceptional heart care, we have created designated Provider Care Teams.  These Care Teams include your primary Cardiologist (physician) and Advanced Practice Providers (APPs -  Physician Assistants and Nurse  Practitioners) who all work together to provide you with the care you need, when you need it.  We recommend signing up for the patient portal called "MyChart".  Sign up information is provided on this After Visit Summary.  MyChart is used to connect with patients for Virtual Visits (Telemedicine).  Patients are able to view lab/test results, encounter notes, upcoming appointments, etc.  Non-urgent messages can be sent  to your provider as well.   To learn more about what you can do with MyChart, go to [email or web address].    Your next appointment:   KEEP FOLLOW-UP   Signed, Georgie Chard, NP  09/07/2022 3:14 PM    [facility]

## 2022-09-07 ENCOUNTER — Other Ambulatory Visit: Payer: Self-pay | Admitting: Cardiology

## 2022-09-07 ENCOUNTER — Ambulatory Visit: Payer: 59 | Attending: Cardiology | Admitting: Cardiology

## 2022-09-07 VITALS — BP 92/64 | HR 99 | Ht 62.0 in | Wt 126.6 lb

## 2022-09-07 DIAGNOSIS — Z8774 Personal history of (corrected) congenital malformations of heart and circulatory system: Secondary | ICD-10-CM

## 2022-09-07 DIAGNOSIS — R002 Palpitations: Secondary | ICD-10-CM

## 2022-09-07 DIAGNOSIS — Q211 Atrial septal defect, unspecified: Secondary | ICD-10-CM

## 2022-09-07 NOTE — Patient Instructions (Signed)
Medication Instructions:  Your physician has recommended you make the following change in your medication:  STOP ASPIRIN, AMOXICILLIN, AND PLAVIX ON 12/28/22  *If you need a refill on your cardiac medications before your next appointment, please call your pharmacy*   Lab Work: NONE If you have labs (blood work) drawn today and your tests are completely normal, you will receive your results only by: MyChart Message (if you have MyChart) OR A paper copy in the mail If you have any lab test that is abnormal or we need to change your treatment, we will call you to review the results.   Testing/Procedures: NONE   Follow-Up: At [facility], you and your health needs are our priority.  As part of our continuing mission to provide you with exceptional heart care, we have created designated Provider Care Teams.  These Care Teams include your primary Cardiologist (physician) and Advanced Practice Providers (APPs -  Physician Assistants and Nurse Practitioners) who all work together to provide you with the care you need, when you need it.  We recommend signing up for the patient portal called "MyChart".  Sign up information is provided on this After Visit Summary.  MyChart is used to connect with patients for Virtual Visits (Telemedicine).  Patients are able to view lab/test results, encounter notes, upcoming appointments, etc.  Non-urgent messages can be sent to your provider as well.   To learn more about what you can do with MyChart, go to [email or web address].    Your next appointment:   KEEP FOLLOW-UP

## 2022-10-14 ENCOUNTER — Ambulatory Visit: Payer: 59 | Admitting: Family Medicine

## 2022-10-17 ENCOUNTER — Ambulatory Visit (INDEPENDENT_AMBULATORY_CARE_PROVIDER_SITE_OTHER): Payer: 59 | Admitting: Family Medicine

## 2022-10-17 VITALS — BP 110/66 | HR 71 | Temp 97.2°F | Ht 62.0 in | Wt 129.2 lb

## 2022-10-17 DIAGNOSIS — E875 Hyperkalemia: Secondary | ICD-10-CM | POA: Diagnosis not present

## 2022-10-17 DIAGNOSIS — R140 Abdominal distension (gaseous): Secondary | ICD-10-CM

## 2022-10-17 DIAGNOSIS — M457 Ankylosing spondylitis of lumbosacral region: Secondary | ICD-10-CM

## 2022-10-17 DIAGNOSIS — M170 Bilateral primary osteoarthritis of knee: Secondary | ICD-10-CM

## 2022-10-17 DIAGNOSIS — R002 Palpitations: Secondary | ICD-10-CM

## 2022-10-17 LAB — BASIC METABOLIC PANEL
BUN: 13 mg/dL (ref 6–23)
CO2: 25 mEq/L (ref 19–32)
Calcium: 9.4 mg/dL (ref 8.4–10.5)
Chloride: 101 mEq/L (ref 96–112)
Creatinine, Ser: 0.71 mg/dL (ref 0.40–1.20)
GFR: 94.51 mL/min (ref >60.00)
Glucose, Bld: 99 mg/dL (ref 70–99)
Potassium: 5.4 mEq/L — ABNORMAL HIGH (ref 3.5–5.1)
Sodium: 132 mEq/L — ABNORMAL LOW (ref 135–145)

## 2022-10-17 NOTE — Assessment & Plan Note (Signed)
Resolved

## 2022-10-17 NOTE — Assessment & Plan Note (Signed)
EGD pending for next week.

## 2022-10-17 NOTE — Progress Notes (Signed)
[facility] [address] [place] [address] Dept: [phone] Dept Fax: [phone]  Chronic Care Office Visit  Subjective:    Patient ID: Emily Woodward, female    DOB: 1965/05/31, 57 y.o..   MRN: [id number]  Chief Complaint  Patient presents with   Follow-up    6 month f/u.  Fasting today.  Wants potassium test done.    History of Present Illness:  Patient is in today for reassessment of chronic medical issues.  Ms. Chirillo has a history of ankylosing spondylitis. She feels this remains in remission. However, she does note some achiness/stiffness in her lower back. Additionally, she has a history of bilateral knee pain. She relates this to issues with rubbing of her posterior patellae. She has noted some increased pain episodically. She sues meloxicam when needed for this, usually for 7-10 days until improved.  Ms. Orlikowski has a history of hyperkalemia. At her last check, this was normal. She would like to make sure this has remained stable.  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 She notes she is scheduled for an EGD next week.  Ms. Freye has a history of palpitations. This started to be an issue about 10 days after she had a catheter closure of an ASD. Her Zio patch study was normal. She notes that this has since resolved.  Past Medical History: Patient Active Problem List   Diagnosis Date Noted   H/O catheter-based closure of atrial septal defect 08/18/2022   Bloating- Cyclic 08/18/2022   Palpitations 08/18/2022   Impingement of right shoulder 04/15/2022   Primary osteoarthritis of both knees 04/15/2022   History of retinal detachment- OS 04/15/2022   History of infection due to ESBL Escherichia coli 08/23/2021   Fracture of scaphoid bone of wrist 07/28/2021   Acquired trigger finger 07/28/2021   Ankylosing spondylitis (HCC) 04/12/2021   Osteopenia 04/12/2021   Lichen planus 04/12/2021   GERD  (gastroesophageal reflux disease) 04/12/2021   Hyperkalemia 04/12/2021   Chronic constipation 04/12/2021   Past Surgical History:  Procedure Laterality Date   ABDOMINAL HYSTERECTOMY     ATRIAL SEPTAL DEFECT(ASD) CLOSURE N/A 06/28/2022   Procedure: ATRIAL SEPTAL DEFECT(ASD) CLOSURE;  Surgeon: Tonny Bollman, MD;  Location: [facility] INVASIVE CV LAB;  Service: Cardiovascular;  Laterality: N/A;   TEE WITHOUT CARDIOVERSION N/A 06/02/2022   Procedure: TRANSESOPHAGEAL ECHOCARDIOGRAM (TEE);  Surgeon: Sande Rives, MD;  Location: [facility] ENDOSCOPY;  Service: Cardiovascular;  Laterality: N/A;   Family History  Problem Relation Age of Onset   Arthritis Mother    Hyperlipidemia Mother    Osteoporosis Mother    Ankylosing spondylitis Father    Diabetes Father    Ankylosing spondylitis Sister    Cancer Sister        ovarian   Ankylosing spondylitis Brother    Outpatient Medications Prior to Visit  Medication Sig Dispense Refill   amoxicillin (AMOXIL) 500 MG capsule Take 4 capsules (2,000 mg total) by mouth as directed. Take 4 tablets 1 hour prior to dental work, including cleanings. 4 capsule 0   aspirin EC 81 MG tablet Take 1 tablet (81 mg total) by mouth daily. Swallow whole. 90 tablet 3   calcium carbonate (OS-CAL - DOSED IN MG OF ELEMENTAL CALCIUM) 1250 (500 Ca) MG tablet Take 1 tablet by mouth in the morning.     clopidogrel (PLAVIX) 75 MG tablet Starting 5 days prior to procedure, take 1 tablet by mouth daily 90 tablet 1   estradiol (  ESTRACE) 0.5 MG tablet Take 1 tablet (0.5 mg total) by mouth daily. 90 tablet 3   lidocaine (XYLOCAINE) 2 % solution Use as directed 15 mLs in the mouth or throat every 3 (three) hours as needed for mouth pain.     meloxicam (MOBIC) 15 MG tablet Take 15 mg by mouth daily as needed for pain.     Multiple Vitamin (MULTIVITAMIN) tablet Take 1 tablet by mouth every other day. In the morning     polyethylene glycol (MIRALAX / GLYCOLAX) 17 g packet Take 17 g by mouth  daily as needed for moderate constipation.     risedronate (ACTONEL) 150 MG tablet Take 1 tablet (150 mg total) by mouth every 30 (thirty) days. 3 tablet 1   No facility-administered medications prior to visit.   No Known Allergies Objective:   Today's Vitals   10/17/22 0757  BP: 110/66  Pulse: 71  Temp: (!) 97.2 F (36.2 C)  TempSrc: Temporal  SpO2: 98%  Weight: 129 lb 3.2 oz (58.6 kg)  Height: 5\' 2"  (1.575 m)   Body mass index is 23.63 kg/m.   General: Well developed, well nourished. No acute distress. Extremities: Full ROM of knees. No joint swelling or tenderness. No edema noted. Skin: Warm and dry. No rashes. Neuro: CN II-XII intact. Normal sensation and DTR bilaterally. Psych: Alert and oriented. Normal mood and affect.  Health Maintenance Due  Topic Date Due   HIV Screening  Never done     Assessment & Plan:   Problem List Items Addressed This Visit       Musculoskeletal and Integument   Ankylosing spondylitis (HCC) - Primary    Continues to be in remission. I will refer her to PT to try and reduce some of the muscular stiffness she is experiencing.      Relevant Orders   Ambulatory referral to Physical Therapy   Primary osteoarthritis of both knees    Has some chronic anterior knee pain and osteoarthritis. I will refer to PT to try and reduce discomfort. She can continue meloxicam 15 mg daily as needed.      Relevant Orders   Ambulatory referral to Physical Therapy     Other   Bloating- Cyclic    EGD pending for next week.      Hyperkalemia    I will recheck the potassium to see if this is remaining stable in a normal range.      Relevant Orders   Basic metabolic panel   Palpitations    Resolved.       Return in about 6 months (around 04/19/2023) for Reassessment.   Loyola Mast, MD

## 2022-10-17 NOTE — Assessment & Plan Note (Signed)
Has some chronic anterior knee pain and osteoarthritis. I will refer to PT to try and reduce discomfort. She can continue meloxicam 15 mg daily as needed.

## 2022-10-17 NOTE — Assessment & Plan Note (Signed)
Continues to be in remission. I will refer her to PT to try and reduce some of the muscular stiffness she is experiencing.

## 2022-10-17 NOTE — Addendum Note (Signed)
Addended by: Loyola Mast on: 10/17/2022 04:56 PM   Modules accepted: Orders

## 2022-10-17 NOTE — Assessment & Plan Note (Signed)
I will recheck the potassium to see if this is remaining stable in a normal range.

## 2022-10-25 ENCOUNTER — Ambulatory Visit: Payer: 59

## 2022-11-01 ENCOUNTER — Ambulatory Visit: Payer: 59 | Attending: Family Medicine | Admitting: Physical Therapy

## 2022-11-01 ENCOUNTER — Other Ambulatory Visit: Payer: Self-pay

## 2022-11-01 ENCOUNTER — Encounter: Payer: Self-pay | Admitting: Physical Therapy

## 2022-11-01 DIAGNOSIS — M6281 Muscle weakness (generalized): Secondary | ICD-10-CM | POA: Diagnosis present

## 2022-11-01 DIAGNOSIS — R29898 Other symptoms and signs involving the musculoskeletal system: Secondary | ICD-10-CM | POA: Insufficient documentation

## 2022-11-01 DIAGNOSIS — M457 Ankylosing spondylitis of lumbosacral region: Secondary | ICD-10-CM | POA: Insufficient documentation

## 2022-11-01 DIAGNOSIS — M25562 Pain in left knee: Secondary | ICD-10-CM | POA: Diagnosis present

## 2022-11-01 DIAGNOSIS — M25561 Pain in right knee: Secondary | ICD-10-CM | POA: Diagnosis present

## 2022-11-01 DIAGNOSIS — G8929 Other chronic pain: Secondary | ICD-10-CM | POA: Diagnosis present

## 2022-11-01 DIAGNOSIS — M170 Bilateral primary osteoarthritis of knee: Secondary | ICD-10-CM | POA: Insufficient documentation

## 2022-11-01 NOTE — Therapy (Signed)
OUTPATIENT PHYSICAL THERAPY THORACOLUMBAR EVALUATION   Patient Name: Emily Woodward MRN: [id number] DOB:Aug 15, 1965, 57 y.o., female Today's Date: 11/01/2022  END OF SESSION:  PT End of Session - 11/01/22 1346     Visit Number 1    Number of Visits 5    Date for PT Re-Evaluation 11/29/22    Authorization Type [employer]    Authorization Time Period 11/01/22 to 11/29/22    PT Start Time 1300    PT Stop Time 1339    PT Time Calculation (min) 39 min    Activity Tolerance Patient tolerated treatment well    Behavior During Therapy [facility] for tasks assessed/performed             Past Medical History:  Diagnosis Date   Ankylosing spondylitis (HCC)    Osteopenia    Past Surgical History:  Procedure Laterality Date   ABDOMINAL HYSTERECTOMY     ATRIAL SEPTAL DEFECT(ASD) CLOSURE N/A 06/28/2022   Procedure: ATRIAL SEPTAL DEFECT(ASD) CLOSURE;  Surgeon: Tonny Bollman, MD;  Location: [facility] INVASIVE CV LAB;  Service: Cardiovascular;  Laterality: N/A;   TEE WITHOUT CARDIOVERSION N/A 06/02/2022   Procedure: TRANSESOPHAGEAL ECHOCARDIOGRAM (TEE);  Surgeon: Sande Rives, MD;  Location: [facility] ENDOSCOPY;  Service: Cardiovascular;  Laterality: N/A;   Patient Active Problem List   Diagnosis Date Noted   H/O catheter-based closure of atrial septal defect 08/18/2022   Bloating- Cyclic 08/18/2022   Palpitations 08/18/2022   Impingement of right shoulder 04/15/2022   Primary osteoarthritis of both knees 04/15/2022   History of retinal detachment- OS 04/15/2022   History of infection due to ESBL Escherichia coli 08/23/2021   Fracture of scaphoid bone of wrist 07/28/2021   Acquired trigger finger 07/28/2021   Ankylosing spondylitis (HCC) 04/12/2021   Osteopenia 04/12/2021   Lichen planus 04/12/2021   GERD (gastroesophageal reflux disease) 04/12/2021   Hyperkalemia 04/12/2021   Chronic constipation 04/12/2021    PCP: Loyola Mast, MD  REFERRING PROVIDER: Loyola Mast, MD  REFERRING DIAG:  M45.7 (ICD-10-CM) - Ankylosing spondylitis of lumbosacral region (HCC) M17.0 (ICD-10-CM) - Primary osteoarthritis of both knees  Rationale for Evaluation and Treatment: Rehabilitation  THERAPY DIAG:  Muscle weakness (generalized)  Chronic pain of right knee  Chronic pain of left knee  Other symptoms and signs involving the musculoskeletal system  ONSET DATE: chronic   SUBJECTIVE:                                                                                                                                                                                           SUBJECTIVE STATEMENT:  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 Wanted to get checked out and see if there's anything else I can do to manage my knee pain besides stretches and braces. I do have AS, tend to have more pain at night, also have a bit of scoliosis. Used to have to take meds to manage AS but now it is under better control but about once a year I have some deep pain that starts in my hip and spreads from there. When AS flares up, Meloxicam really helps and improves flares significant within about 5 days. Have been trying some quad strengthening at the gym too. In the past I was always in pain but now the majority of the time I am pain free, I am able to do light slow running but always with knee braces. Stairs and hiking are difficult.   PERTINENT HISTORY:  Ankylosing spondylitis, osteopenia, ASD closure, TEE without cardioversion   PAIN:  Are you having pain?  "Medial right knee has been tender for the past few weeks, having some general aches and pains"/10  PRECAUTIONS: None  RED FLAGS: None   WEIGHT BEARING RESTRICTIONS: No  FALLS:  Has patient fallen in last 6 months? No  LIVING ENVIRONMENT: Lives with: lives with their spouse Lives in: House/apartment Stairs: one STE, flight to upstairs in home  Has following equipment at home:  None  OCCUPATION: retired   PLOF: Independent, Independent with basic ADLs, Independent with gait, and Independent with transfers  PATIENT GOALS: improve maintenance program, work on Print production planner, Armed forces operational officer"   NEXT MD VISIT: Dr. Veto Kemps in about 6 months   OBJECTIVE:    PATIENT SURVEYS:  FOTO 2nd   SCREENING FOR RED FLAGS: Bowel or bladder incontinence: No Spinal tumors: No Cauda equina syndrome: No Compression fracture: No Abdominal aneurysm: No  COGNITION: Overall cognitive status: Within functional limits for tasks assessed       MUSCLE LENGTH: Quads, hams, hip flexors WNL     PALPATION:  B patellas hypermobile laterally/medially, a bit stiff otherwise but pt guarding due to hx of past patella dislocations (floating patella syndrome)  LUMBAR ROM:   AROM eval  Flexion WNL   Extension WNL   Right lateral flexion WNL   Left lateral flexion WNL  Right rotation WNL  Left rotation WNL  Knee extension WNL  Knee flexion  WNL   (Blank rows = not tested)    LOWER EXTREMITY MMT:    MMT Right eval Left eval  Hip flexion 3 3  Hip extension 3 3  Hip abduction 4 4+  Hip adduction    Hip internal rotation    Hip external rotation    Knee flexion 4 4  Knee extension 4+ 4+  Ankle dorsiflexion 5 5  Ankle plantarflexion    Ankle inversion    Ankle eversion     (Blank rows = not tested)     TODAY'S TREATMENT:  DATE:   Eval   Objective measures, care planning, HEP   TherEx  Nustep L4x6 minutes BLEs only  Bridges + ABD green TB x10 Sidelying hip ABD green TB x10 Walking bridges x7 Crunches with lumbar spine in neutral x10 (limited ROM)     PATIENT EDUCATION:  Education details: exam findings, HEP, POC  Person educated: Patient Education method: Explanation, Demonstration, and Handouts Education comprehension:  verbalized understanding and returned demonstration  HOME EXERCISE PROGRAM: Access Code: [email or web address] URL: [email or web address] Date: 11/01/2022 Prepared by: Nedra Hai  Exercises - Supine Bridge with Resistance Band  - 1-2 x daily - 7 x weekly - 1 sets - 10 reps - 2 seconds  hold - Sidelying Hip Abduction with Resistance at Thighs  - 1-2 x daily - 7 x weekly - 1 sets - 10 reps - 1 second  hold - Bridge Walk Out  - 1-2 x daily - 7 x weekly - 1 sets - 5-10 reps - Curl Up with Reach  - 1-2 x daily - 7 x weekly - 1 sets - 10 reps - 1 second  hold  ASSESSMENT:  CLINICAL IMPRESSION: Patient is a 57 y.o. F who was seen today for physical therapy evaluation and treatment for M45.7 (ICD-10-CM) - Ankylosing spondylitis of lumbosacral region (HCC) M17.0 (ICD-10-CM) - Primary osteoarthritis of both knees. Exam with objective findings as above. Overall she is very well versed in long term management of her AS and is very active, very interested in making sure she has a good long term appropriate maintenance program for her knees and back. Will benefit from skilled PT services to address concerns and assist in developing optimal long term balanced exercise program moving forward.    OBJECTIVE IMPAIRMENTS: difficulty walking, decreased strength, and pain.   ACTIVITY LIMITATIONS: squatting, stairs, and locomotion level  PARTICIPATION LIMITATIONS: driving, shopping, and community activity  PERSONAL FACTORS: Age, Behavior pattern, Education, Fitness, Past/current experiences, and Time since onset of injury/illness/exacerbation are also affecting patient's functional outcome.   REHAB POTENTIAL: Excellent  CLINICAL DECISION MAKING: Stable/uncomplicated  EVALUATION COMPLEXITY: Low   GOALS: Goals reviewed with patient? Yes  SHORT TERM GOALS: Target date: 11/29/2022    Will be compliant with appropriate long term balanced exercise program to include functional strengthening and  appropriate progressions  Baseline: Goal status: INITIAL  2.  MMT to have improved by at least 1 grade in all weak groups  Baseline:  Goal status: INITIAL  3.  Will be able to explain principles of balanced training program (endurance, strength, balance) and recovery especially in context of AS flare  Baseline:  Goal status: INITIAL  4.  Will be able to climb flight of stairs at home and hike as desired without increase in B knee pain  Baseline:  Goal status: INITIAL    LONG TERM GOALS: Target date: LTGs = STGs     PLAN:  PT FREQUENCY: 1x/week  PT DURATION: 4 weeks  PLANNED INTERVENTIONS: Therapeutic exercises, Therapeutic activity, Gait training, Patient/Family education, Self Care, Orthotic/Fit training, Aquatic Therapy, and Re-evaluation.  PLAN FOR NEXT SESSION: needs assessment of running form (when she has knee braces with her), focus on functional strengthening for BLEs/hips/core with weekly HEP updates. Work towards long term maintenance program   Nedra Hai, S.N.P.J., DPT 11/01/22 1:55 PM

## 2022-11-01 NOTE — Addendum Note (Signed)
Addended by: Milinda Pointer on: 11/01/2022 02:02 PM   Modules accepted: Orders

## 2022-11-09 ENCOUNTER — Encounter: Payer: Self-pay | Admitting: Physical Therapy

## 2022-11-09 ENCOUNTER — Ambulatory Visit: Payer: 59 | Attending: Family Medicine | Admitting: Physical Therapy

## 2022-11-09 DIAGNOSIS — G8929 Other chronic pain: Secondary | ICD-10-CM | POA: Insufficient documentation

## 2022-11-09 DIAGNOSIS — M6281 Muscle weakness (generalized): Secondary | ICD-10-CM | POA: Insufficient documentation

## 2022-11-09 DIAGNOSIS — M25561 Pain in right knee: Secondary | ICD-10-CM | POA: Diagnosis present

## 2022-11-09 DIAGNOSIS — R29898 Other symptoms and signs involving the musculoskeletal system: Secondary | ICD-10-CM | POA: Insufficient documentation

## 2022-11-09 DIAGNOSIS — M25562 Pain in left knee: Secondary | ICD-10-CM | POA: Insufficient documentation

## 2022-11-09 NOTE — Therapy (Signed)
OUTPATIENT PHYSICAL THERAPY THORACOLUMBAR TREATMENT   Patient Name: Emily Woodward MRN: [id number] DOB:08-13-1965, 57 y.o., female Today's Date: 11/09/2022  END OF SESSION:  PT End of Session - 11/09/22 1258     Visit Number 2    Date for PT Re-Evaluation 11/29/22    PT Start Time 1300    PT Stop Time 1345    PT Time Calculation (min) 45 min    Activity Tolerance Patient tolerated treatment well    Behavior During Therapy [facility] for tasks assessed/performed             Past Medical History:  Diagnosis Date   Ankylosing spondylitis (HCC)    Osteopenia    Past Surgical History:  Procedure Laterality Date   ABDOMINAL HYSTERECTOMY     ATRIAL SEPTAL DEFECT(ASD) CLOSURE N/A 06/28/2022   Procedure: ATRIAL SEPTAL DEFECT(ASD) CLOSURE;  Surgeon: Tonny Bollman, MD;  Location: [facility];  Service: Cardiovascular;  Laterality: N/A;   TEE WITHOUT CARDIOVERSION N/A 06/02/2022   Procedure: TRANSESOPHAGEAL ECHOCARDIOGRAM (TEE);  Surgeon: Sande Rives, MD;  Location: [facility] ENDOSCOPY;  Service: Cardiovascular;  Laterality: N/A;   Patient Active Problem List   Diagnosis Date Noted   H/O catheter-based closure of atrial septal defect 08/18/2022   Bloating- Cyclic 08/18/2022   Palpitations 08/18/2022   Impingement of right shoulder 04/15/2022   Primary osteoarthritis of both knees 04/15/2022   History of retinal detachment- OS 04/15/2022   History of infection due to ESBL Escherichia coli 08/23/2021   Fracture of scaphoid bone of wrist 07/28/2021   Acquired trigger finger 07/28/2021   Ankylosing spondylitis (HCC) 04/12/2021   Osteopenia 04/12/2021   Lichen planus 04/12/2021   GERD (gastroesophageal reflux disease) 04/12/2021   Hyperkalemia 04/12/2021   Chronic constipation 04/12/2021    PCP: Loyola Mast, MD  REFERRING PROVIDER: Loyola Mast, MD  REFERRING DIAG: M45.7 (ICD-10-CM) - Ankylosing spondylitis of lumbosacral region (HCC) M17.0 (ICD-10-CM) - Primary  osteoarthritis of both knees  Rationale for Evaluation and Treatment: Rehabilitation  THERAPY DIAG:  Chronic pain of right knee  Chronic pain of left knee  Muscle weakness (generalized)  ONSET DATE: chronic   SUBJECTIVE:                                                                                                                                                                                           SUBJECTIVE STATEMENT: "Feeling good"    PERTINENT HISTORY:  Ankylosing spondylitis, osteopenia, ASD closure, TEE without cardioversion   PAIN:  Are you having pain?  0/10  PRECAUTIONS: None  RED FLAGS: None   WEIGHT BEARING RESTRICTIONS: No  FALLS:  Has patient fallen in last 6 months? No  LIVING ENVIRONMENT: Lives with: lives with their spouse Lives in: House/apartment Stairs: one STE, flight to upstairs in home  Has following equipment at home: None  OCCUPATION: retired   PLOF: Independent, Independent with basic ADLs, Independent with gait, and Independent with transfers  PATIENT GOALS: improve maintenance program, work on Print production planner, Armed forces operational officer"   NEXT MD VISIT: Dr. Veto Kemps in about 6 months   OBJECTIVE:    PATIENT SURVEYS:  FOTO 2nd   SCREENING FOR RED FLAGS: Bowel or bladder incontinence: No Spinal tumors: No Cauda equina syndrome: No Compression fracture: No Abdominal aneurysm: No  COGNITION: Overall cognitive status: Within functional limits for tasks assessed       MUSCLE LENGTH: Quads, hams, hip flexors WNL     PALPATION:  B patellas hypermobile laterally/medially, a bit stiff otherwise but pt guarding due to hx of past patella dislocations (floating patella syndrome)  LUMBAR ROM:   AROM eval  Flexion WNL   Extension WNL   Right lateral flexion WNL   Left lateral flexion WNL  Right rotation WNL  Left rotation WNL  Knee extension WNL  Knee flexion  WNL   (Blank rows = not tested)    LOWER EXTREMITY MMT:     MMT Right eval Left eval  Hip flexion 3 3  Hip extension 3 3  Hip abduction 4 4+  Hip adduction    Hip internal rotation    Hip external rotation    Knee flexion 4 4  Knee extension 4+ 4+  Ankle dorsiflexion 5 5  Ankle plantarflexion    Ankle inversion    Ankle eversion     (Blank rows = not tested)     TODAY'S TREATMENT:                                                                                                                              DATE:  11/09/22 Nustep L5x6 minutes  LAQ RLE 3lb 2x10, Seated R hip flex 3lb x 10  HS curls RLE green 2x10 S2S holding 4lb 2x10  Forward and lateral Step ups 4in box on airex x 10 each Shoulder Ext 5lb 2x10 Standing rows blue 2x10 Bridges  LE on Pball bridges, oblq, K2C  Eval Objective measures, care planning, HEP   TherEx  Nustep L4x6 minutes BLEs only  Bridges + ABD green TB x10 Sidelying hip ABD green TB x10 Walking bridges x7 Crunches with lumbar spine in neutral x10 (limited ROM)     PATIENT EDUCATION:  Education details: exam findings, HEP, POC  Person educated: Patient Education method: Explanation, Demonstration, and Handouts Education comprehension: verbalized understanding and returned demonstration  HOME EXERCISE PROGRAM: Access Code: [email or web address] URL: [email or web address] Date: 11/09/2022 Prepared by: Debroah Baller  Exercises - Supine Bridge with Resistance Band  - 1-2 x daily - 7 x weekly - 1 sets - 10 reps - 2 seconds  hold - Sidelying Hip Abduction with Resistance at Thighs  - 1-2 x daily - 7 x weekly - 1 sets - 10 reps - 1 second  hold - Bridge Walk Out  - 1-2 x daily - 7 x weekly - 1 sets - 5-10 reps - Curl Up with Reach  - 1-2 x daily - 7 x weekly - 1 sets - 10 reps - 1 second  hold - Sit to Stand Without Arm Support  - 1 x daily - 7 x weekly - 3 sets - 10 reps - Standing Row with Resistance  - 1 x daily - 7 x weekly - 3 sets - 10 reps - Step Up  - 1 x daily - 7 x weekly - 3  sets - 10 reps  ASSESSMENT:  CLINICAL IMPRESSION: Patient is a 57 y.o. F who was seen today for physical therapy treatment for Ankylosing spondylitis of lumbosacral region and Primary osteoarthritis of both knees. Overall she is very well versed in long term management of her AS and is very active, very interested in making sure she has a good long term appropriate maintenance program for her knees and back. Session consisted of LE and core strengthening.Postural weakness noted with shoulder ext, cues to prevent forward trunk flexion. Some compensation present with sit to stands favoring her LLE. Will benefit from skilled PT services to address concerns and assist in developing optimal long term balanced exercise program moving forward.    OBJECTIVE IMPAIRMENTS: difficulty walking, decreased strength, and pain.   ACTIVITY LIMITATIONS: squatting, stairs, and locomotion level  PARTICIPATION LIMITATIONS: driving, shopping, and community activity  PERSONAL FACTORS: Age, Behavior pattern, Education, Fitness, Past/current experiences, and Time since onset of injury/illness/exacerbation are also affecting patient's functional outcome.   REHAB POTENTIAL: Excellent  CLINICAL DECISION MAKING: Stable/uncomplicated  EVALUATION COMPLEXITY: Low   GOALS: Goals reviewed with patient? Yes  SHORT TERM GOALS: Target date: 11/29/2022    Will be compliant with appropriate long term balanced exercise program to include functional strengthening and appropriate progressions  Baseline: Goal status: INITIAL  2.  MMT to have improved by at least 1 grade in all weak groups  Baseline:  Goal status: INITIAL  3.  Will be able to explain principles of balanced training program (endurance, strength, balance) and recovery especially in context of AS flare  Baseline:  Goal status: INITIAL  4.  Will be able to climb flight of stairs at home and hike as desired without increase in B knee pain  Baseline:  Goal  status: INITIAL    LONG TERM GOALS: Target date: LTGs = STGs     PLAN:  PT FREQUENCY: 1x/week  PT DURATION: 4 weeks  PLANNED INTERVENTIONS: Therapeutic exercises, Therapeutic activity, Gait training, Patient/Family education, Self Care, Orthotic/Fit training, Aquatic Therapy, and Re-evaluation.  PLAN FOR NEXT SESSION: needs assessment of running form (when she has knee braces with her), focus on functional strengthening for BLEs/hips/core with weekly HEP updates. Work towards long term maintenance program   Debroah Baller, PTA 11/09/22 12:58 PM

## 2022-11-16 ENCOUNTER — Ambulatory Visit: Payer: 59 | Admitting: Physical Therapy

## 2022-11-16 ENCOUNTER — Encounter: Payer: Self-pay | Admitting: Physical Therapy

## 2022-11-16 DIAGNOSIS — M6281 Muscle weakness (generalized): Secondary | ICD-10-CM

## 2022-11-16 DIAGNOSIS — G8929 Other chronic pain: Secondary | ICD-10-CM

## 2022-11-16 DIAGNOSIS — M25561 Pain in right knee: Secondary | ICD-10-CM | POA: Diagnosis not present

## 2022-11-16 DIAGNOSIS — R29898 Other symptoms and signs involving the musculoskeletal system: Secondary | ICD-10-CM

## 2022-11-16 NOTE — Therapy (Signed)
OUTPATIENT PHYSICAL THERAPY THORACOLUMBAR TREATMENT   Patient Name: Emily Woodward MRN: [id number] DOB:[birth year], 57 y.o., female Today's Date: 11/16/2022  END OF SESSION:  PT End of Session - 11/16/22 1258     Visit Number 3    Date for PT Re-Evaluation 11/29/22    PT Start Time 1300    PT Stop Time 1345    PT Time Calculation (min) 45 min    Activity Tolerance Patient tolerated treatment well    Behavior During Therapy [facility] for tasks assessed/performed             Past Medical History:  Diagnosis Date   Ankylosing spondylitis (HCC)    Osteopenia    Past Surgical History:  Procedure Laterality Date   ABDOMINAL HYSTERECTOMY     ATRIAL SEPTAL DEFECT(ASD) CLOSURE N/A 06/28/2022   Procedure: ATRIAL SEPTAL DEFECT(ASD) CLOSURE;  Surgeon: Tonny Bollman, MD;  Location: [facility] INVASIVE CV LAB;  Service: Cardiovascular;  Laterality: N/A;   TEE WITHOUT CARDIOVERSION N/A 06/02/2022   Procedure: TRANSESOPHAGEAL ECHOCARDIOGRAM (TEE);  Surgeon: Sande Rives, MD;  Location: [facility] ENDOSCOPY;  Service: Cardiovascular;  Laterality: N/A;   Patient Active Problem List   Diagnosis Date Noted   H/O catheter-based closure of atrial septal defect 08/18/2022   Bloating- Cyclic 08/18/2022   Palpitations 08/18/2022   Impingement of right shoulder 04/15/2022   Primary osteoarthritis of both knees 04/15/2022   History of retinal detachment- OS 04/15/2022   History of infection due to ESBL Escherichia coli 08/23/2021   Fracture of scaphoid bone of wrist 07/28/2021   Acquired trigger finger 07/28/2021   Ankylosing spondylitis (HCC) 04/12/2021   Osteopenia 04/12/2021   Lichen planus 04/12/2021   GERD (gastroesophageal reflux disease) 04/12/2021   Hyperkalemia 04/12/2021   Chronic constipation 04/12/2021    PCP: Loyola Mast, MD  REFERRING PROVIDER: Loyola Mast, MD  REFERRING DIAG: M45.7 (ICD-10-CM) - Ankylosing spondylitis of lumbosacral region (HCC) M17.0 (ICD-10-CM) - Primary  osteoarthritis of both knees  Rationale for Evaluation and Treatment: Rehabilitation  THERAPY DIAG:  Chronic pain of right knee  Muscle weakness (generalized)  Chronic pain of left knee  Other symptoms and signs involving the musculoskeletal system  ONSET DATE: chronic   SUBJECTIVE:                                                                                                                                                                                           SUBJECTIVE STATEMENT: "Doing ok, still have a little pinch"  pinch iin the medial R knee   PERTINENT HISTORY:  Ankylosing spondylitis, osteopenia, ASD closure, TEE without cardioversion   PAIN:  Are you having pain?  02-4/10 Medial R knee when walking  PRECAUTIONS: None  RED FLAGS: None   WEIGHT BEARING RESTRICTIONS: No  FALLS:  Has patient fallen in last 6 months? No  LIVING ENVIRONMENT: Lives with: lives with their spouse Lives in: House/apartment Stairs: one STE, flight to upstairs in home  Has following equipment at home: None  OCCUPATION: retired   PLOF: Independent, Independent with basic ADLs, Independent with gait, and Independent with transfers  PATIENT GOALS: improve maintenance program, work on Print production planner, Armed forces operational officer"   NEXT MD VISIT: Dr. Veto Kemps in about 6 months   OBJECTIVE:    PATIENT SURVEYS:  FOTO 2nd   SCREENING FOR RED FLAGS: Bowel or bladder incontinence: No Spinal tumors: No Cauda equina syndrome: No Compression fracture: No Abdominal aneurysm: No  COGNITION: Overall cognitive status: Within functional limits for tasks assessed       MUSCLE LENGTH: Quads, hams, hip flexors WNL     PALPATION:  B patellas hypermobile laterally/medially, a bit stiff otherwise but pt guarding due to hx of past patella dislocations (floating patella syndrome)  LUMBAR ROM:   AROM eval  Flexion WNL   Extension WNL   Right lateral flexion WNL   Left lateral flexion  WNL  Right rotation WNL  Left rotation WNL  Knee extension WNL  Knee flexion  WNL   (Blank rows = not tested)    LOWER EXTREMITY MMT:    MMT Right eval Left eval  Hip flexion 3 3  Hip extension 3 3  Hip abduction 4 4+  Hip adduction    Hip internal rotation    Hip external rotation    Knee flexion 4 4  Knee extension 4+ 4+  Ankle dorsiflexion 5 5  Ankle plantarflexion    Ankle inversion    Ankle eversion     (Blank rows = not tested)     TODAY'S TREATMENT:                                                                                                                              DATE:  11/16/22 Bile L 3 x 4 min NuStep L 5 x 4 min  Resisted gait 20lb 4 way x 3 each HS curls 25lb 2x12 Leg Ext 10lb 2x12 Seated rows & Lats 20lb 2x10 Shoulder Ext 5lb 2x10 Leg press 40lb 2x12 Step ups airex on 6in box   11/09/22 Nustep L5x6 minutes  LAQ RLE 3lb 2x10, Seated R hip flex 3lb x 10  HS curls RLE green 2x10 S2S holding 4lb 2x10  Forward and lateral Step ups 4in box on airex x 10 each Shoulder Ext 5lb 2x10 Standing rows blue 2x10 Bridges  LE on Pball bridges, oblq, K2C  Eval Objective measures, care planning, HEP   TherEx  Nustep L4x6 minutes BLEs only  Bridges + ABD green TB x10 Sidelying hip ABD green TB x10 Walking bridges x7 Crunches with lumbar spine  in neutral x10 (limited ROM)     PATIENT EDUCATION:  Education details: exam findings, HEP, POC  Person educated: Patient Education method: Explanation, Demonstration, and Handouts Education comprehension: verbalized understanding and returned demonstration  HOME EXERCISE PROGRAM: Access Code: [email or web address] URL: [email or web address] Date: 11/09/2022 Prepared by: Debroah Baller  Exercises - Supine Bridge with Resistance Band  - 1-2 x daily - 7 x weekly - 1 sets - 10 reps - 2 seconds  hold - Sidelying Hip Abduction with Resistance at Thighs  - 1-2 x daily - 7 x weekly - 1 sets - 10 reps - 1  second  hold - Bridge Walk Out  - 1-2 x daily - 7 x weekly - 1 sets - 5-10 reps - Curl Up with Reach  - 1-2 x daily - 7 x weekly - 1 sets - 10 reps - 1 second  hold - Sit to Stand Without Arm Support  - 1 x daily - 7 x weekly - 3 sets - 10 reps - Standing Row with Resistance  - 1 x daily - 7 x weekly - 3 sets - 10 reps - Step Up  - 1 x daily - 7 x weekly - 3 sets - 10 reps  ASSESSMENT:  CLINICAL IMPRESSION: Patient is a 57 y.o. F who was seen today for physical therapy treatment for Ankylosing spondylitis of lumbosacral region and Primary osteoarthritis of both knees. Overall she is very well versed in long term management of her AS and is very active, very interested in making sure she has a good long term appropriate maintenance program for her knees and back. Again session consisted of LE and core strengthening .Postural weakness noted with shoulder ext remains, cues to prevent forward trunk flexion. Some hip weakness noted with step ups. Overall pt gives good effort with all interventions. Will benefit from skilled PT services to address concerns and assist in developing optimal long term balanced exercise program moving forward.    OBJECTIVE IMPAIRMENTS: difficulty walking, decreased strength, and pain.   ACTIVITY LIMITATIONS: squatting, stairs, and locomotion level  PARTICIPATION LIMITATIONS: driving, shopping, and community activity  PERSONAL FACTORS: Age, Behavior pattern, Education, Fitness, Past/current experiences, and Time since onset of injury/illness/exacerbation are also affecting patient's functional outcome.   REHAB POTENTIAL: Excellent  CLINICAL DECISION MAKING: Stable/uncomplicated  EVALUATION COMPLEXITY: Low   GOALS: Goals reviewed with patient? Yes  SHORT TERM GOALS: Target date: 11/29/2022    Will be compliant with appropriate long term balanced exercise program to include functional strengthening and appropriate progressions  Baseline: Goal status:  INITIAL  2.  MMT to have improved by at least 1 grade in all weak groups  Baseline:  Goal status: INITIAL  3.  Will be able to explain principles of balanced training program (endurance, strength, balance) and recovery especially in context of AS flare  Baseline:  Goal status: INITIAL  4.  Will be able to climb flight of stairs at home and hike as desired without increase in B knee pain  Baseline:  Goal status: on going, going up stairs more challenging    LONG TERM GOALS: Target date: LTGs = STGs     PLAN:  PT FREQUENCY: 1x/week  PT DURATION: 4 weeks  PLANNED INTERVENTIONS: Therapeutic exercises, Therapeutic activity, Gait training, Patient/Family education, Self Care, Orthotic/Fit training, Aquatic Therapy, and Re-evaluation.  PLAN FOR NEXT SESSION: needs assessment of running form (when she has knee braces with her), focus on functional strengthening for BLEs/hips/core with weekly HEP updates. Work towards  long term maintenance program   Debroah Baller, PTA 11/16/22 1:00 PM

## 2022-11-22 ENCOUNTER — Encounter: Payer: Self-pay | Admitting: Physical Therapy

## 2022-11-22 ENCOUNTER — Ambulatory Visit: Payer: 59 | Admitting: Physical Therapy

## 2022-11-22 DIAGNOSIS — M6281 Muscle weakness (generalized): Secondary | ICD-10-CM

## 2022-11-22 DIAGNOSIS — M25561 Pain in right knee: Secondary | ICD-10-CM | POA: Diagnosis not present

## 2022-11-22 DIAGNOSIS — G8929 Other chronic pain: Secondary | ICD-10-CM

## 2022-11-22 DIAGNOSIS — R29898 Other symptoms and signs involving the musculoskeletal system: Secondary | ICD-10-CM

## 2022-11-22 NOTE — Therapy (Signed)
OUTPATIENT PHYSICAL THERAPY THORACOLUMBAR TREATMENT   Patient Name: Emily Woodward MRN: [id number] DOB:09-15-1965, 57 y.o., female Today's Date: 11/22/2022  END OF SESSION:  PT End of Session - 11/22/22 1300     Visit Number 4    Date for PT Re-Evaluation 11/29/22    PT Start Time 1301    PT Stop Time 1345    PT Time Calculation (min) 44 min    Activity Tolerance Patient tolerated treatment well    Behavior During Therapy [facility] for tasks assessed/performed             Past Medical History:  Diagnosis Date   Ankylosing spondylitis (HCC)    Osteopenia    Past Surgical History:  Procedure Laterality Date   ABDOMINAL HYSTERECTOMY     ATRIAL SEPTAL DEFECT(ASD) CLOSURE N/A 06/28/2022   Procedure: ATRIAL SEPTAL DEFECT(ASD) CLOSURE;  Surgeon: Tonny Bollman, MD;  Location: [facility];  Service: Cardiovascular;  Laterality: N/A;   TEE WITHOUT CARDIOVERSION N/A 06/02/2022   Procedure: TRANSESOPHAGEAL ECHOCARDIOGRAM (TEE);  Surgeon: Sande Rives, MD;  Location: [facility] ENDOSCOPY;  Service: Cardiovascular;  Laterality: N/A;   Patient Active Problem List   Diagnosis Date Noted   H/O catheter-based closure of atrial septal defect 08/18/2022   Bloating- Cyclic 08/18/2022   Palpitations 08/18/2022   Impingement of right shoulder 04/15/2022   Primary osteoarthritis of both knees 04/15/2022   History of retinal detachment- OS 04/15/2022   History of infection due to ESBL Escherichia coli 08/23/2021   Fracture of scaphoid bone of wrist 07/28/2021   Acquired trigger finger 07/28/2021   Ankylosing spondylitis (HCC) 04/12/2021   Osteopenia 04/12/2021   Lichen planus 04/12/2021   GERD (gastroesophageal reflux disease) 04/12/2021   Hyperkalemia 04/12/2021   Chronic constipation 04/12/2021    PCP: Loyola Mast, MD  REFERRING PROVIDER: Loyola Mast, MD  REFERRING DIAG: M45.7 (ICD-10-CM) - Ankylosing spondylitis of lumbosacral region (HCC) M17.0 (ICD-10-CM) - Primary  osteoarthritis of both knees  Rationale for Evaluation and Treatment: Rehabilitation  THERAPY DIAG:  Chronic pain of right knee  Muscle weakness (generalized)  Chronic pain of left knee  Other symptoms and signs involving the musculoskeletal system  ONSET DATE: chronic   SUBJECTIVE:                                                                                                                                                                                           SUBJECTIVE STATEMENT: "Ok" Some pain in the medial L knee   PERTINENT HISTORY:  Ankylosing spondylitis, osteopenia, ASD closure, TEE without cardioversion   PAIN:  Are you having pain?  3/10  Medial R knee when walking  PRECAUTIONS: None  RED FLAGS: None   WEIGHT BEARING RESTRICTIONS: No  FALLS:  Has patient fallen in last 6 months? No  LIVING ENVIRONMENT: Lives with: lives with their spouse Lives in: House/apartment Stairs: one STE, flight to upstairs in home  Has following equipment at home: None  OCCUPATION: retired   PLOF: Independent, Independent with basic ADLs, Independent with gait, and Independent with transfers  PATIENT GOALS: improve maintenance program, work on Print production planner, Armed forces operational officer"   NEXT MD VISIT: Dr. Veto Kemps in about 6 months   OBJECTIVE:    PATIENT SURVEYS:  FOTO 2nd   SCREENING FOR RED FLAGS: Bowel or bladder incontinence: No Spinal tumors: No Cauda equina syndrome: No Compression fracture: No Abdominal aneurysm: No  COGNITION: Overall cognitive status: Within functional limits for tasks assessed       MUSCLE LENGTH: Quads, hams, hip flexors WNL     PALPATION:  B patellas hypermobile laterally/medially, a bit stiff otherwise but pt guarding due to hx of past patella dislocations (floating patella syndrome)  LUMBAR ROM:   AROM eval  Flexion WNL   Extension WNL   Right lateral flexion WNL   Left lateral flexion WNL  Right rotation WNL  Left  rotation WNL  Knee extension WNL  Knee flexion  WNL   (Blank rows = not tested)    LOWER EXTREMITY MMT:    MMT Right eval Left eval  Hip flexion 3 3  Hip extension 3 3  Hip abduction 4 4+  Hip adduction    Hip internal rotation    Hip external rotation    Knee flexion 4 4  Knee extension 4+ 4+  Ankle dorsiflexion 5 5  Ankle plantarflexion    Ankle inversion    Ankle eversion     (Blank rows = not tested)     TODAY'S TREATMENT:                                                                                                                              DATE:  11/22/22 NuStep L 5 x 4 min  Step ups forward and lateral 4in box on airex x10 each 20lb resisted side steps over WaTE bar x5  R knee PROM and STM to medial knee LAQ 3lb w/ add squeeze 2x10 SLR 3lb w/ ER x10 each Ball squeeze and bridge  HS and ITB stretching bilaterally   11/16/22 Bike L 3 x 4 min NuStep L 5 x 4 min  Resisted gait 20lb 4 way x 3 each HS curls 25lb 2x12 Leg Ext 10lb 2x12 Seated rows & Lats 20lb 2x10 Shoulder Ext 5lb 2x10 Leg press 40lb 2x12 Step ups airex on 6in box   11/09/22 Nustep L5x6 minutes  LAQ RLE 3lb 2x10, Seated R hip flex 3lb x 10  HS curls RLE green 2x10 S2S holding 4lb 2x10  Forward and lateral Step ups 4in box on airex x  10 each Shoulder Ext 5lb 2x10 Standing rows blue 2x10 Bridges  LE on Pball bridges, oblq, K2C  Eval Objective measures, care planning, HEP   TherEx  Nustep L4x6 minutes BLEs only  Bridges + ABD green TB x10 Sidelying hip ABD green TB x10 Walking bridges x7 Crunches with lumbar spine in neutral x10 (limited ROM)     PATIENT EDUCATION:  Education details: exam findings, HEP, POC  Person educated: Patient Education method: Explanation, Demonstration, and Handouts Education comprehension: verbalized understanding and returned demonstration  HOME EXERCISE PROGRAM: Access Code: [email or web address] URL: [email or web address] Date:  11/09/2022 Prepared by: Debroah Baller  Exercises - Supine Bridge with Resistance Band  - 1-2 x daily - 7 x weekly - 1 sets - 10 reps - 2 seconds  hold - Sidelying Hip Abduction with Resistance at Thighs  - 1-2 x daily - 7 x weekly - 1 sets - 10 reps - 1 second  hold - Bridge Walk Out  - 1-2 x daily - 7 x weekly - 1 sets - 5-10 reps - Curl Up with Reach  - 1-2 x daily - 7 x weekly - 1 sets - 10 reps - 1 second  hold - Sit to Stand Without Arm Support  - 1 x daily - 7 x weekly - 3 sets - 10 reps - Standing Row with Resistance  - 1 x daily - 7 x weekly - 3 sets - 10 reps - Step Up  - 1 x daily - 7 x weekly - 3 sets - 10 reps  ASSESSMENT:  CLINICAL IMPRESSION: Patient is a 57 y.o. F who was seen today for physical therapy treatment for Ankylosing spondylitis of lumbosacral region and Primary osteoarthritis of both knees. Overall she is very well versed in long term management of her AS and is very active, very interested in making sure she has a good long term appropriate maintenance program for her knees and back. Session started with functional strengthening of the LE's. Some lateral tracking o patell noted with during MT so interventions chosen to aid with that. Pt reports she could feel the muscle working with SLR ER.  Will benefit from skilled PT services to address concerns and assist in developing optimal long term balanced exercise program moving forward.    OBJECTIVE IMPAIRMENTS: difficulty walking, decreased strength, and pain.   ACTIVITY LIMITATIONS: squatting, stairs, and locomotion level  PARTICIPATION LIMITATIONS: driving, shopping, and community activity  PERSONAL FACTORS: Age, Behavior pattern, Education, Fitness, Past/current experiences, and Time since onset of injury/illness/exacerbation are also affecting patient's functional outcome.   REHAB POTENTIAL: Excellent  CLINICAL DECISION MAKING: Stable/uncomplicated  EVALUATION COMPLEXITY: Low   GOALS: Goals reviewed  with patient? Yes  SHORT TERM GOALS: Target date: 11/29/2022    Will be compliant with appropriate long term balanced exercise program to include functional strengthening and appropriate progressions  Baseline: Goal status: INITIAL  2.  MMT to have improved by at least 1 grade in all weak groups  Baseline:  Goal status: INITIAL  3.  Will be able to explain principles of balanced training program (endurance, strength, balance) and recovery especially in context of AS flare  Baseline:  Goal status: Met 11/22/22  4.  Will be able to climb flight of stairs at home and hike as desired without increase in B knee pain  Baseline:  Goal status: on going, going up stairs more challenging    LONG TERM GOALS: Target date: LTGs = STGs     PLAN:  PT  FREQUENCY: 1x/week  PT DURATION: 4 weeks  PLANNED INTERVENTIONS: Therapeutic exercises, Therapeutic activity, Gait training, Patient/Family education, Self Care, Orthotic/Fit training, Aquatic Therapy, and Re-evaluation.  PLAN FOR NEXT SESSION: needs assessment of running form (when she has knee braces with her), focus on functional strengthening for BLEs/hips/core with weekly HEP updates. Work towards long term maintenance program   Debroah Baller, PTA 11/22/22 1:01 PM

## 2022-11-29 ENCOUNTER — Encounter: Payer: Self-pay | Admitting: Gastroenterology

## 2022-11-29 ENCOUNTER — Ambulatory Visit (INDEPENDENT_AMBULATORY_CARE_PROVIDER_SITE_OTHER): Payer: 59 | Admitting: Gastroenterology

## 2022-11-29 VITALS — BP 100/62 | HR 74 | Ht 62.0 in | Wt 130.0 lb

## 2022-11-29 DIAGNOSIS — R140 Abdominal distension (gaseous): Secondary | ICD-10-CM

## 2022-11-29 DIAGNOSIS — R1010 Upper abdominal pain, unspecified: Secondary | ICD-10-CM

## 2022-11-29 DIAGNOSIS — K5909 Other constipation: Secondary | ICD-10-CM

## 2022-11-29 NOTE — Patient Instructions (Signed)
_______________________________________________________  If your blood pressure at your visit was 140/90 or greater, please contact your primary care physician to follow up on this.  _______________________________________________________  If you are age 57 or older, your body mass index should be between 23-30. Your Body mass index is 23.78 kg/m. If this is out of the aforementioned range listed, please consider follow up with your Primary Care Provider.  If you are age 77 or younger, your body mass index should be between 19-25. Your Body mass index is 23.78 kg/m. If this is out of the aformentioned range listed, please consider follow up with your Primary Care Provider.   ________________________________________________________  The [facility] providers would like to encourage you to use [facility] to communicate with providers for non-urgent requests or questions.  Due to long hold times on the telephone, sending your provider a message by [facility] may be a faster and more efficient way to get a response.  Please allow 48 business hours for a response.  Please remember that this is for non-urgent requests.  _______________________________________________________  Bonita Quin have been scheduled for an endoscopy. Please follow written instructions given to you at your visit today.  If you use inhalers (even only as needed), please bring them with you on the day of your procedure.  If you take any of the following medications, they will need to be adjusted prior to your procedure:   DO NOT TAKE 7 DAYS PRIOR TO TEST- Trulicity (dulaglutide) Ozempic, Wegovy (semaglutide) Mounjaro (tirzepatide) Bydureon Bcise (exanatide extended release)  DO NOT TAKE 1 DAY PRIOR TO YOUR TEST Rybelsus (semaglutide) Adlyxin (lixisenatide) Victoza (liraglutide) Byetta (exanatide) ___________________________________________________________________________  Due to recent changes in healthcare laws, you may see the  results of your imaging and laboratory studies on MyChart before your provider has had a chance to review them.  We understand that in some cases there may be results that are confusing or concerning to you. Not all laboratory results come back in the same time frame and the provider may be waiting for multiple results in order to interpret others.  Please give [place] 48 hours in order for your provider to thoroughly review all the results before contacting the office for clarification of your results.   It was a pleasure to see you today!  Thank you for trusting me with your gastrointestinal care!

## 2022-11-29 NOTE — Progress Notes (Addendum)
 [facility] Consult Note:  History: Emily Woodward 11/29/2022  Referring provider: Thedora Garnette HERO, MD  Reason for consult/chief complaint: Bloated (Onset x 2 years, pain, bloated, would last 3 to 4 days at a time, since moving back from [place] it seems to happen monthly but in past 2 months no episodes. )   Subjective  HPI: Emily Woodward reports about 2 years of abdominal pain and bloating and will typically last several days at a time.  [place]  from [place] these episodes happened about monthly, though last episode was about 2 months ago. When episodes would occur, she would have bandlike upper abdominal bloating and pressure with the area would feel hard.  It would come in spasms about every 15 to 30 minutes much of the day before subsiding gradually day by day over several days.  It seems to her these might be associated with periods of more severe constipation.  She has tended toward constipation with a BM every 2 to 3 days, then will pass a large formed stool.  She started taking Pepcid and trying to be more diligent about fiber water intake and activity to improve her bowel movements in recent months.  Denies nausea vomiting early satiety dysphagia or weight loss.  She saw primary care about 3 months ago for intermittent burning epigastric discomfort and abdominal bloating.  According to referral notes, she had apparently not noticed any clear food or medicine triggers.  ROS:  Review of Systems  Constitutional:  Negative for appetite change and unexpected weight change.  HENT:  Negative for mouth sores and voice change.   Eyes:  Negative for pain and redness.  Respiratory:  Negative for cough and shortness of breath.   Cardiovascular:  Negative for chest pain and palpitations.  Genitourinary:  Negative for dysuria and hematuria.  Musculoskeletal:  Negative for arthralgias and myalgias.  Skin:  Negative for pallor and rash.  Neurological:  Negative for  weakness and headaches.  Hematological:  Negative for adenopathy.     Past Medical History: Past Medical History:  Diagnosis Date   Ankylosing spondylitis (HCC)    Atrial fibrillation (HCC)    Osteopenia      Past Surgical History: Past Surgical History:  Procedure Laterality Date   ABDOMINAL HYSTERECTOMY     partial, has her ovaries   ATRIAL SEPTAL DEFECT(ASD) CLOSURE N/A 06/28/2022   Procedure: ATRIAL SEPTAL DEFECT(ASD) CLOSURE;  Surgeon: Wonda Sharper, MD;  Location: [facility]VASIVE [facility] LAB;  Service: Cardiovascular;  Laterality: N/A;   TEE WITHOUT CARDIOVERSION N/A 06/02/2022   Procedure: TRANSESOPHAGEAL ECHOCARDIOGRAM (TEE);  Surgeon: Barbaraann Darryle Ned, MD;  Location: [facility] ENDOSCOPY;  Service: Cardiovascular;  Laterality: N/A;     Family History: Family History  Problem Relation Age of Onset   Arthritis Mother    Hyperlipidemia Mother    Osteoporosis Mother    Ankylosing spondylitis Father    Diabetes Father    Ankylosing spondylitis Sister    Ovarian cancer Sister    Ankylosing spondylitis Brother    Other Daughter        lactose and gluten intolerant   Irritable bowel syndrome Daughter    Colon cancer Neg Hx    Esophageal cancer Neg Hx     Social History: Social History   Socioeconomic History   Marital status: Married    Spouse name: Not on file   Number of children: 1   Years of education: Not on file   Highest education level: Master's  degree (e.g., MA, MS, MEng, MEd, MSW, MBA)  Occupational History   Occupation: Retired- Surveyor, minerals  Tobacco Use   Smoking status: Never   Smokeless tobacco: Never  Vaping Use   Vaping status: Never Used  Substance and Sexual Activity   Alcohol use: Yes    Comment: socailly   Drug use: Never   Sexual activity: Yes    Partners: Male    Birth control/protection: Surgical  Other Topics Concern   Not on file  Social History Narrative   Lives with husband   Social Determinants of Health   Financial  Resource Strain: Low Risk  (08/18/2022)   Overall Financial Resource Strain (CARDIA)    Difficulty of Paying Living Expenses: Not hard at all  Food Insecurity: Unknown (08/18/2022)   Hunger Vital Sign    Worried About Running Out of Food in the Last Year: Never true    Ran Out of Food in the Last Year: Patient declined  Transportation Needs: Patient Declined (08/18/2022)   PRAPARE - Transportation    Lack of Transportation (Medical): Patient declined    Lack of Transportation (Non-Medical): Patient declined  Physical Activity: Sufficiently Active (08/18/2022)   Exercise Vital Sign    Days of Exercise per Week: 5 days    Minutes of Exercise per Session: 50 min  Stress: No Stress Concern Present (08/18/2022)   [employer] of Occupational Health - Occupational Stress Questionnaire    Feeling of Stress : Not at all  Social Connections: Unknown (08/18/2022)   Social Connection and Isolation Panel [NHANES]    Frequency of Communication with Friends and Family: More than three times a week    Frequency of Social Gatherings with Friends and Family: Once a week    Attends Religious Services: Patient declined    Database administrator or Organizations: No    Attends Engineer, structural: Not on file    Marital Status: Married   Her daughter is a resident family medicine in [place]   Moved to [place] for her husband's job.  Has lived in both [place]  and [place] before moving here.  Retired Sport and exercise psychologist for [employer]  Allergies: No Known Allergies  Outpatient Meds: Current Outpatient Medications  Medication Sig Dispense Refill   aspirin  EC 81 MG tablet Take 1 tablet (81 mg total) by mouth daily. Swallow whole. 90 tablet 3   calcium carbonate (OS-CAL - DOSED IN MG OF ELEMENTAL CALCIUM) 1250 (500 Ca) MG tablet Take 1 tablet by mouth in the morning.     clopidogrel  (PLAVIX ) 75 MG tablet Starting 5 days prior to procedure, take 1 tablet by mouth daily 90 tablet 1    estradiol  (ESTRACE ) 0.5 MG tablet Take 1 tablet (0.5 mg total) by mouth daily. 90 tablet 3   lidocaine  (XYLOCAINE ) 2 % solution Use as directed 15 mLs in the mouth or throat every 3 (three) hours as needed for mouth pain.     meloxicam (MOBIC) 15 MG tablet Take 15 mg by mouth daily as needed for pain.     Multiple Vitamin (MULTIVITAMIN) tablet Take 1 tablet by mouth every other day. In the morning     polyethylene glycol (MIRALAX / GLYCOLAX) 17 g packet Take 17 g by mouth daily as needed for moderate constipation.     risedronate  (ACTONEL ) 150 MG tablet Take 1 tablet (150 mg total) by mouth every 30 (thirty) days. 3 tablet 1   No current facility-administered medications for this visit.  ___________________________________________________________________ Objective   Exam:  BP 100/62   Pulse 74   Ht 5' 2 (1.575 m)   Wt 130 lb (59 kg)   BMI 23.78 kg/m  Wt Readings from Last 3 Encounters:  11/29/22 130 lb (59 kg)  10/17/22 129 lb 3.2 oz (58.6 kg)  09/07/22 126 lb 9.6 oz (57.4 kg)    General: Well-appearing Eyes: sclera anicteric, no redness ENT: oral mucosa moist without lesions, no cervical or supraclavicular lymphadenopathy CV: Regular without appreciable murmur, no JVD, no peripheral edema Resp: clear to auscultation bilaterally, normal RR and effort noted GI: soft, no tenderness, with active bowel sounds. No guarding or palpable organomegaly noted. Skin; warm and dry, no rash or jaundice noted Neuro: awake, alert and oriented x 3. Normal gross motor function and fluent speech  Labs:  In June of this year: Normal CBC, CMP, TTG IgA antibody, deamidated antigliadin IgG, CA125 No TSH on file  Radiologic Studies:  No abdominal imaging  Assessment: Encounter Diagnoses  Name Primary?   Upper abdominal pain Yes   Bloating    Chronic constipation     Somewhat difficult to characterize, but the symptoms do seem to be episodic and fairly regular on a monthly basis  except perhaps more recently.  She wondered about H. pylori, particularly having lived in [place].  A worthwhile consideration, though seems unusual for gastritis or ulcer to cause episodic symptoms without vomiting to suggest obstruction.  Raises suspicion for biliary colic, less likely fluid collection or ovarian cyst. It could be related to periods of worsening constipation. Plan:  Upper endoscopy in about 6 weeks after she will be permanently off Plavix .  She was agreeable after discussion of procedure and risks.  The benefits and risks of the planned procedure were described in detail with the patient or (when appropriate) their health care proxy.  Risks were outlined as including, but not limited to, bleeding, infection, perforation, adverse medication reaction leading to cardiac or pulmonary decompensation, pancreatitis (if ERCP).  The limitation of incomplete mucosal visualization was also discussed.  No guarantees or warranties were given.  Abdominal ultrasound  Thank you for the courtesy of this consult.  Please call me with any questions or concerns.  Victory LITTIE Brand III  CC: Referring provider noted above  ____________  Record review addendum on 11/23/2023  Colonoscopy report received by fax from [facility]   Colonoscopy 02/25/2016 for screening  Small hemorrhoids and otherwise normal colonoscopy to the cecum with adequate prep. 10-year recall recommended  Will set recall for this practice  - H. Brand, MD

## 2022-11-30 ENCOUNTER — Encounter: Payer: Self-pay | Admitting: Physical Therapy

## 2022-11-30 ENCOUNTER — Ambulatory Visit: Payer: 59 | Admitting: Physical Therapy

## 2022-11-30 DIAGNOSIS — G8929 Other chronic pain: Secondary | ICD-10-CM

## 2022-11-30 DIAGNOSIS — M6281 Muscle weakness (generalized): Secondary | ICD-10-CM

## 2022-11-30 DIAGNOSIS — M25561 Pain in right knee: Secondary | ICD-10-CM | POA: Diagnosis not present

## 2022-11-30 DIAGNOSIS — R29898 Other symptoms and signs involving the musculoskeletal system: Secondary | ICD-10-CM

## 2022-11-30 NOTE — Therapy (Signed)
OUTPATIENT PHYSICAL THERAPY THORACOLUMBAR TREATMENT   Patient Name: Emily Woodward MRN: [id number] DOB:1965-06-17, 57 y.o., female Today's Date: 11/30/2022  END OF SESSION:  PT End of Session - 11/30/22 1301     Visit Number 5    Date for PT Re-Evaluation 11/29/22    PT Start Time 1300    PT Stop Time 1345    PT Time Calculation (min) 45 min    Activity Tolerance Patient tolerated treatment well    Behavior During Therapy [facility] for tasks assessed/performed             Past Medical History:  Diagnosis Date   Ankylosing spondylitis (HCC)    Atrial fibrillation (HCC)    Osteopenia    Past Surgical History:  Procedure Laterality Date   ABDOMINAL HYSTERECTOMY     partial, has her ovaries   ATRIAL SEPTAL DEFECT(ASD) CLOSURE N/A 06/28/2022   Procedure: ATRIAL SEPTAL DEFECT(ASD) CLOSURE;  Surgeon: Tonny Bollman, MD;  Location: [facility];  Service: Cardiovascular;  Laterality: N/A;   TEE WITHOUT CARDIOVERSION N/A 06/02/2022   Procedure: TRANSESOPHAGEAL ECHOCARDIOGRAM (TEE);  Surgeon: Sande Rives, MD;  Location: [facility] ENDOSCOPY;  Service: Cardiovascular;  Laterality: N/A;   Patient Active Problem List   Diagnosis Date Noted   H/O catheter-based closure of atrial septal defect 08/18/2022   Bloating- Cyclic 08/18/2022   Palpitations 08/18/2022   Impingement of right shoulder 04/15/2022   Primary osteoarthritis of both knees 04/15/2022   History of retinal detachment- OS 04/15/2022   History of infection due to ESBL Escherichia coli 08/23/2021   Fracture of scaphoid bone of wrist 07/28/2021   Acquired trigger finger 07/28/2021   Ankylosing spondylitis (HCC) 04/12/2021   Osteopenia 04/12/2021   Lichen planus 04/12/2021   GERD (gastroesophageal reflux disease) 04/12/2021   Hyperkalemia 04/12/2021   Chronic constipation 04/12/2021    PCP: Loyola Mast, MD  REFERRING PROVIDER: Loyola Mast, MD  REFERRING DIAG: M45.7 (ICD-10-CM) - Ankylosing spondylitis  of lumbosacral region (HCC) M17.0 (ICD-10-CM) - Primary osteoarthritis of both knees  Rationale for Evaluation and Treatment: Rehabilitation  THERAPY DIAG:  Chronic pain of right knee  Muscle weakness (generalized)  Chronic pain of left knee  Other symptoms and signs involving the musculoskeletal system  ONSET DATE: chronic   SUBJECTIVE:                                                                                                                                                                                           SUBJECTIVE STATEMENT: Knees are slightly better today.    PERTINENT HISTORY:  Ankylosing spondylitis, osteopenia, ASD closure, TEE without cardioversion  PAIN:  Are you having pain?  2/10 Medial R knee when walking  PRECAUTIONS: None  RED FLAGS: None   WEIGHT BEARING RESTRICTIONS: No  FALLS:  Has patient fallen in last 6 months? No  LIVING ENVIRONMENT: Lives with: lives with their spouse Lives in: House/apartment Stairs: one STE, flight to upstairs in home  Has following equipment at home: None  OCCUPATION: retired   PLOF: Independent, Independent with basic ADLs, Independent with gait, and Independent with transfers  PATIENT GOALS: improve maintenance program, work on Print production planner, Armed forces operational officer"   NEXT MD VISIT: Dr. Veto Kemps in about 6 months   OBJECTIVE:    PATIENT SURVEYS:  FOTO 2nd   SCREENING FOR RED FLAGS: Bowel or bladder incontinence: No Spinal tumors: No Cauda equina syndrome: No Compression fracture: No Abdominal aneurysm: No  COGNITION: Overall cognitive status: Within functional limits for tasks assessed       MUSCLE LENGTH: Quads, hams, hip flexors WNL     PALPATION:  B patellas hypermobile laterally/medially, a bit stiff otherwise but pt guarding due to hx of past patella dislocations (floating patella syndrome)  LUMBAR ROM:   AROM eval  Flexion WNL   Extension WNL   Right lateral flexion WNL    Left lateral flexion WNL  Right rotation WNL  Left rotation WNL  Knee extension WNL  Knee flexion  WNL   (Blank rows = not tested)    LOWER EXTREMITY MMT:    MMT Right eval Left eval Right 11/30/22 Left 11/30/22  Hip flexion 3 3 4 4   Hip extension 3 3 4 4   Hip abduction 4 4+    Hip adduction      Hip internal rotation      Hip external rotation      Knee flexion 4 4 4+ 4+  Knee extension 4+ 4+ 5 5  Ankle dorsiflexion 5 5    Ankle plantarflexion      Ankle inversion      Ankle eversion       (Blank rows = not tested)     TODAY'S TREATMENT:                                                                                                                              DATE:  11/30/22 NuStep L4 x 6 min 6 in step alt pattern 8in step ups x5 reports some pressure in the R knee Checked Goals  LAQ 3lb 2x15 HS curls green 2x15 SLR 2lb w/ ER 2x10 each   11/22/22 NuStep L 5 x 4 min  Step ups forward and lateral 4in box on airex x10 each 20lb resisted side steps over WaTE bar x5  R knee PROM and STM to medial knee LAQ 3lb w/ add squeeze 2x10 SLR 3lb w/ ER x10 each Ball squeeze and bridge  HS and ITB stretching bilaterally   11/16/22 Bike L 3 x 4 min NuStep L 5 x 4 min  Resisted gait 20lb 4 way x 3 each HS curls 25lb 2x12 Leg Ext 10lb 2x12 Seated rows & Lats 20lb 2x10 Shoulder Ext 5lb 2x10 Leg press 40lb 2x12 Step ups airex on 6in box   11/09/22 Nustep L5x6 minutes  LAQ RLE 3lb 2x10, Seated R hip flex 3lb x 10  HS curls RLE green 2x10 S2S holding 4lb 2x10  Forward and lateral Step ups 4in box on airex x 10 each Shoulder Ext 5lb 2x10 Standing rows blue 2x10 Bridges  LE on Pball bridges, oblq, K2C  Eval Objective measures, care planning, HEP   TherEx  Nustep L4x6 minutes BLEs only  Bridges + ABD green TB x10 Sidelying hip ABD green TB x10 Walking bridges x7 Crunches with lumbar spine in neutral x10 (limited ROM)     PATIENT EDUCATION:  Education  details: exam findings, HEP, POC  Person educated: Patient Education method: Explanation, Demonstration, and Handouts Education comprehension: verbalized understanding and returned demonstration  HOME EXERCISE PROGRAM: Access Code: [email or web address] URL: [email or web address] Date: 11/09/2022 Prepared by: Debroah Baller  Exercises - Supine Bridge with Resistance Band  - 1-2 x daily - 7 x weekly - 1 sets - 10 reps - 2 seconds  hold - Sidelying Hip Abduction with Resistance at Thighs  - 1-2 x daily - 7 x weekly - 1 sets - 10 reps - 1 second  hold - Bridge Walk Out  - 1-2 x daily - 7 x weekly - 1 sets - 5-10 reps - Curl Up with Reach  - 1-2 x daily - 7 x weekly - 1 sets - 10 reps - 1 second  hold - Sit to Stand Without Arm Support  - 1 x daily - 7 x weekly - 3 sets - 10 reps - Standing Row with Resistance  - 1 x daily - 7 x weekly - 3 sets - 10 reps - Step Up  - 1 x daily - 7 x weekly - 3 sets - 10 reps  ASSESSMENT:  CLINICAL IMPRESSION: Patient is a 58 y.o. F who was seen today for physical therapy treatment for Ankylosing spondylitis of lumbosacral region and Primary osteoarthritis of both knees. Overall she is very well versed in long term management of her AS and is very active, very interested in making sure she has a good long term appropriate maintenance program for her knees and back. She has progressed meeting most of her LTG's. She reports occasional knee and L hip pain with ADL. She reports that she has been walking daily up and down hills. She expressed confidence that's he has enough exercises at home to continues her care on her own.   OBJECTIVE IMPAIRMENTS: difficulty walking, decreased strength, and pain.   ACTIVITY LIMITATIONS: squatting, stairs, and locomotion level  PARTICIPATION LIMITATIONS: driving, shopping, and community activity  PERSONAL FACTORS: Age, Behavior pattern, Education, Fitness, Past/current experiences, and Time since onset of  injury/illness/exacerbation are also affecting patient's functional outcome.   REHAB POTENTIAL: Excellent  CLINICAL DECISION MAKING: Stable/uncomplicated  EVALUATION COMPLEXITY: Low   GOALS: Goals reviewed with patient? Yes  SHORT TERM GOALS: Target date: 11/29/2022    Will be compliant with appropriate long term balanced exercise program to include functional strengthening and appropriate progressions  Baseline: Goal status: Sharolyn Douglas daily wit her husband  2.  MMT to have improved by at least 1 grade in all weak groups  Baseline:  Goal status: Met 11/30/22  3.  Will be able to explain principles of balanced training program (endurance, strength,  balance) and recovery especially in context of AS flare  Baseline:  Goal status: Met 11/22/22  4.  Will be able to climb flight of stairs at home and hike as desired without increase in B knee pain  Baseline:  Goal status: Partly met has not tried to hike yet  11/30/22    LONG TERM GOALS: Target date: LTGs = STGs     PLAN:  PT FREQUENCY: 1x/week  PT DURATION: 4 weeks  PLANNED INTERVENTIONS: Therapeutic exercises, Therapeutic activity, Gait training, Patient/Family education, Self Care, Orthotic/Fit training, Aquatic Therapy, and Re-evaluation.  PLAN FOR NEXT SESSION: needs assessment of running form (when she has knee braces with her), focus on functional strengthening for BLEs/hips/core with weekly HEP updates. Work towards long term maintenance program  PHYSICAL THERAPY DISCHARGE SUMMARY  Visits from Start of Care: 5  Patient agrees to discharge. Patient goals were partially met. Patient is being discharged due to being pleased with the current functional level.  Debroah Baller, PTA 11/30/22 1:02 PM

## 2022-12-02 ENCOUNTER — Ambulatory Visit (HOSPITAL_COMMUNITY): Payer: 59

## 2022-12-02 ENCOUNTER — Encounter (HOSPITAL_COMMUNITY): Payer: Self-pay

## 2022-12-07 ENCOUNTER — Encounter (HOSPITAL_COMMUNITY): Payer: Self-pay

## 2022-12-07 ENCOUNTER — Ambulatory Visit (HOSPITAL_COMMUNITY): Admission: RE | Admit: 2022-12-07 | Payer: 59 | Source: Ambulatory Visit

## 2022-12-16 ENCOUNTER — Ambulatory Visit (HOSPITAL_COMMUNITY)
Admission: RE | Admit: 2022-12-16 | Discharge: 2022-12-16 | Disposition: A | Discharge status: Home / Self Care | Payer: 59 | Source: Ambulatory Visit | Attending: Gastroenterology | Admitting: Gastroenterology

## 2022-12-16 DIAGNOSIS — R140 Abdominal distension (gaseous): Secondary | ICD-10-CM | POA: Insufficient documentation

## 2022-12-16 DIAGNOSIS — R1010 Upper abdominal pain, unspecified: Secondary | ICD-10-CM | POA: Insufficient documentation

## 2022-12-16 DIAGNOSIS — K5909 Other constipation: Secondary | ICD-10-CM | POA: Diagnosis present

## 2023-01-02 ENCOUNTER — Ambulatory Visit (INDEPENDENT_AMBULATORY_CARE_PROVIDER_SITE_OTHER): Payer: 59 | Admitting: Family Medicine

## 2023-01-02 VITALS — BP 104/66 | HR 88 | Temp 98.5°F | Ht 62.0 in | Wt 128.4 lb

## 2023-01-02 DIAGNOSIS — J069 Acute upper respiratory infection, unspecified: Secondary | ICD-10-CM | POA: Diagnosis not present

## 2023-01-02 DIAGNOSIS — M8580 Other specified disorders of bone density and structure, unspecified site: Secondary | ICD-10-CM

## 2023-01-02 MED ORDER — ESTRADIOL 0.025 MG/24HR TD PTWK: 0.0250 mg | MEDICATED_PATCH | TRANSDERMAL | 12 refills | Status: DC

## 2023-01-02 MED ORDER — ALBUTEROL SULFATE HFA 108 (90 BASE) MCG/ACT IN AERS
2.0000 | INHALATION_SPRAY | Freq: Four times a day (QID) | RESPIRATORY_TRACT | 2 refills | Status: DC | PRN
Start: 2023-01-02 — End: 2023-06-07

## 2023-01-02 NOTE — Assessment & Plan Note (Signed)
Discussed home care for viral illness, including rest, pushing fluids, and OTC medications as needed for symptom relief. Recommend hot tea with honey for sore throat symptoms. I will add albuterol to see if this helps with the respiratory component. Follow-up if needed for worsening or persistent symptoms.

## 2023-01-02 NOTE — Assessment & Plan Note (Signed)
Ms. Weinstock asks about switching from an oral estrogen to a patch. She notes her daughter, a physician, had recommended this. I have no objections.

## 2023-01-02 NOTE — Progress Notes (Signed)
[facility] [address] [place] [address] Dept: [phone] Dept Fax: [phone]  Office Visit  Subjective:    Patient ID: Emily Woodward, female    DOB: 31-Aug-1965, 57 y.o..   MRN: [id number]  Chief Complaint  Patient presents with   Cough    C/o having cough, chest/head congestion, wheezing, sneezing x 1 week.  Has taken mucinex-DM   History of Present Illness:  Patient is in today for with an 8-day history of headache, body aches, fever (to 101 F) and chills, body aches, cough, nasal congestion and sneezing. She has been having some right ear pain int he past 24 hours, which prompted her being seen. She has also noted some chest tightness. 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 She notes her symptoms sometimes recur with illness.  Past Medical History: Patient Active Problem List   Diagnosis Date Noted   H/O catheter-based closure of atrial septal defect 08/18/2022   Bloating- Cyclic 08/18/2022   Palpitations 08/18/2022   Impingement of right shoulder 04/15/2022   Primary osteoarthritis of both knees 04/15/2022   History of retinal detachment- OS 04/15/2022   History of infection due to ESBL Escherichia coli 08/23/2021   Fracture of scaphoid bone of wrist 07/28/2021   Acquired trigger finger 07/28/2021   Ankylosing spondylitis (HCC) 04/12/2021   Osteopenia 04/12/2021   Lichen planus 04/12/2021   GERD (gastroesophageal reflux disease) 04/12/2021   Hyperkalemia 04/12/2021   Chronic constipation 04/12/2021   Past Surgical History:  Procedure Laterality Date   ABDOMINAL HYSTERECTOMY     partial, has her ovaries   ATRIAL SEPTAL DEFECT(ASD) CLOSURE N/A 06/28/2022   Procedure: ATRIAL SEPTAL DEFECT(ASD) CLOSURE;  Surgeon: Tonny Bollman, MD;  Location: [facility] INVASIVE CV LAB;  Service: Cardiovascular;  Laterality: N/A;   TEE WITHOUT CARDIOVERSION N/A 06/02/2022   Procedure:  TRANSESOPHAGEAL ECHOCARDIOGRAM (TEE);  Surgeon: Sande Rives, MD;  Location: [facility] ENDOSCOPY;  Service: Cardiovascular;  Laterality: N/A;   Family History  Problem Relation Age of Onset   Arthritis Mother    Hyperlipidemia Mother    Osteoporosis Mother    Ankylosing spondylitis Father    Diabetes Father    Ankylosing spondylitis Sister    Ovarian cancer Sister    Ankylosing spondylitis Brother    Other Daughter        lactose and gluten intolerant   Irritable bowel syndrome Daughter    Colon cancer Neg Hx    Esophageal cancer Neg Hx    Outpatient Medications Prior to Visit  Medication Sig Dispense Refill   calcium carbonate (OS-CAL - DOSED IN MG OF ELEMENTAL CALCIUM) 1250 (500 Ca) MG tablet Take 1 tablet by mouth in the morning.     meloxicam (MOBIC) 15 MG tablet Take 15 mg by mouth daily as needed for pain.     Multiple Vitamin (MULTIVITAMIN) tablet Take 1 tablet by mouth every other day. In the morning     polyethylene glycol (MIRALAX / GLYCOLAX) 17 g packet Take 17 g by mouth daily as needed for moderate constipation.     risedronate (ACTONEL) 150 MG tablet Take 1 tablet (150 mg total) by mouth every 30 (thirty) days. 3 tablet 1   estradiol (ESTRACE) 0.5 MG tablet Take 1 tablet (0.5 mg total) by mouth daily. 90 tablet 3   lidocaine (XYLOCAINE) 2 % solution Use as directed 15 mLs in the mouth or throat every 3 (three) hours as needed for mouth pain. (  Patient not taking: Reported on 01/02/2023)     aspirin EC 81 MG tablet Take 1 tablet (81 mg total) by mouth daily. Swallow whole. (Patient not taking: Reported on 01/02/2023) 90 tablet 3   clopidogrel (PLAVIX) 75 MG tablet Starting 5 days prior to procedure, take 1 tablet by mouth daily 90 tablet 1   No facility-administered medications prior to visit.   No Known Allergies   Objective:   Today's Vitals   01/02/23 1255  BP: 104/66  Pulse: 88  Temp: 98.5 F (36.9 C)  TempSrc: Temporal  SpO2: 98%  Weight: 128 lb 6.4 oz  (58.2 kg)  Height: 5\' 2"  (1.575 m)   Body mass index is 23.48 kg/m.   General: Well developed, well nourished. No acute distress. HEENT: Normocephalic, non-traumatic. Conjunctiva clear. External ears normal. EAC and TMs normal bilaterally.   Nose clear without congestion or rhinorrhea. Mucous membranes moist. Oropharynx clear. Good dentition. Neck: Supple. No lymphadenopathy. No thyromegaly. Lungs: Clear to auscultation bilaterally, though with prolonged expiratory phase of breathing. No wheezing, rales or   rhonchi. Psych: Alert and oriented. Normal mood and affect.  Health Maintenance Due  Topic Date Due   HIV Screening  Never done   Cervical Cancer Screening (HPV/Pap Cotest)  Never done   INFLUENZA VACCINE  10/06/2022     Assessment & Plan:   Problem List Items Addressed This Visit       Respiratory   Viral URI with cough - Primary    Discussed home care for viral illness, including rest, pushing fluids, and OTC medications as needed for symptom relief. Recommend hot tea with honey for sore throat symptoms. I will add albuterol to see if this helps with the respiratory component. Follow-up if needed for worsening or persistent symptoms.       Relevant Medications   albuterol (VENTOLIN HFA) 108 (90 Base) MCG/ACT inhaler     Musculoskeletal and Integument   Osteopenia    Emily Woodward asks about switching from an oral estrogen to a patch. She notes her daughter, a physician, had recommended this. I have no objections.      Relevant Medications   estradiol (CLIMARA - DOSED IN MG/24 HR) 0.025 mg/24hr patch    No follow-ups on file.   Loyola Mast, MD

## 2023-01-10 ENCOUNTER — Encounter: Payer: Self-pay | Admitting: Gastroenterology

## 2023-01-23 ENCOUNTER — Ambulatory Visit: Payer: 59 | Admitting: Gastroenterology

## 2023-01-23 ENCOUNTER — Encounter: Payer: Self-pay | Admitting: Gastroenterology

## 2023-01-23 VITALS — BP 92/61 | HR 68 | Temp 98.0°F | Resp 16 | Ht 62.0 in | Wt 130.0 lb

## 2023-01-23 DIAGNOSIS — K2950 Unspecified chronic gastritis without bleeding: Secondary | ICD-10-CM

## 2023-01-23 DIAGNOSIS — R140 Abdominal distension (gaseous): Secondary | ICD-10-CM

## 2023-01-23 DIAGNOSIS — K2940 Chronic atrophic gastritis without bleeding: Secondary | ICD-10-CM

## 2023-01-23 DIAGNOSIS — R1010 Upper abdominal pain, unspecified: Secondary | ICD-10-CM

## 2023-01-23 DIAGNOSIS — K2951 Unspecified chronic gastritis with bleeding: Secondary | ICD-10-CM | POA: Diagnosis not present

## 2023-01-23 MED ORDER — SODIUM CHLORIDE 0.9 % IV SOLN: 500.0000 mL | INTRAVENOUS | Status: DC

## 2023-01-23 NOTE — Progress Notes (Signed)
Patient states there have been no changes to medical or surgical history since time of pre-visit. 

## 2023-01-23 NOTE — Progress Notes (Signed)
History and Physical:  This patient presents for endoscopic testing for: Encounter Diagnosis  Name Primary?   Upper abdominal pain Yes    57 year old woman here today for evaluation of episodic upper abdominal pain bloating and distention.  Clinical details in my office consult note of 11/29/2022,with no significant clinical change since then. A subsequent abdominal ultrasound was unrevealing for source of symptoms, and an incidental right hepatic hemangioma was seen.  Patient is otherwise without complaints or active issues today.   Past Medical History: Past Medical History:  Diagnosis Date   Ankylosing spondylitis (HCC)    Atrial fibrillation (HCC)    Osteopenia      Past Surgical History: Past Surgical History:  Procedure Laterality Date   ABDOMINAL HYSTERECTOMY     partial, has her ovaries   ATRIAL SEPTAL DEFECT(ASD) CLOSURE N/A 06/28/2022   Procedure: ATRIAL SEPTAL DEFECT(ASD) CLOSURE;  Surgeon: Tonny Bollman, MD;  Location: [facility] INVASIVE CV LAB;  Service: Cardiovascular;  Laterality: N/A;   TEE WITHOUT CARDIOVERSION N/A 06/02/2022   Procedure: TRANSESOPHAGEAL ECHOCARDIOGRAM (TEE);  Surgeon: Sande Rives, MD;  Location: [facility] ENDOSCOPY;  Service: Cardiovascular;  Laterality: N/A;    Allergies: No Known Allergies  Outpatient Meds: Current Outpatient Medications  Medication Sig Dispense Refill   calcium carbonate (OS-CAL - DOSED IN MG OF ELEMENTAL CALCIUM) 1250 (500 Ca) MG tablet Take 1 tablet by mouth in the morning.     estradiol (CLIMARA - DOSED IN MG/24 HR) 0.025 mg/24hr patch Place 1 patch (0.025 mg total) onto the skin once a week. 4 patch 12   Multiple Vitamin (MULTIVITAMIN) tablet Take 1 tablet by mouth every other day. In the morning     albuterol (VENTOLIN HFA) 108 (90 Base) MCG/ACT inhaler Inhale 2 puffs into the lungs every 6 (six) hours as needed for wheezing or shortness of breath. 8 g 2   meloxicam (MOBIC) 15 MG tablet Take 15 mg by mouth daily as  needed for pain.     polyethylene glycol (MIRALAX / GLYCOLAX) 17 g packet Take 17 g by mouth daily as needed for moderate constipation.     risedronate (ACTONEL) 150 MG tablet Take 1 tablet (150 mg total) by mouth every 30 (thirty) days. 3 tablet 1   Current Facility-Administered Medications  Medication Dose Route Frequency Provider Last Rate Last Admin   0.9 %  sodium chloride infusion  500 mL Intravenous Continuous Danis, Starr Lake III, MD          ___________________________________________________________________ Objective   Exam:  BP 101/68   Pulse 62   Temp 98 F (36.7 C) (Skin)   Ht 5\' 2"  (1.575 m)   Wt 130 lb (59 kg)   SpO2 100%   BMI 23.78 kg/m   CV: regular , S1/S2 Resp: clear to auscultation bilaterally, normal RR and effort noted GI: soft, no tenderness, with active bowel sounds.   Assessment: Encounter Diagnosis  Name Primary?   Upper abdominal pain Yes     Plan: EGD  The benefits and risks of the planned procedure were described in detail with the patient or (when appropriate) their health care proxy.  Risks were outlined as including, but not limited to, bleeding, infection, perforation, adverse medication reaction leading to cardiac or pulmonary decompensation, pancreatitis (if ERCP).  The limitation of incomplete mucosal visualization was also discussed.  No guarantees or warranties were given.  The patient is appropriate for an endoscopic procedure in the ambulatory setting.   - Amada Jupiter, MD

## 2023-01-23 NOTE — Progress Notes (Signed)
Called to room to assist during endoscopic procedure.  Patient ID and intended procedure confirmed with present staff. Received instructions for my participation in the procedure from the performing physician.  

## 2023-01-23 NOTE — Progress Notes (Signed)
Sedate, gd SR, tolerated procedure well, VSS, report to RN 

## 2023-01-23 NOTE — Op Note (Signed)
[facility] Patient Name: Emily Woodward Procedure Date: 01/23/2023 7:35 AM MRN: [id number] Endoscopist: Sherilyn Cooter L. Myrtie Neither , MD, [id number] Age: 57 Referring MD:  Date of Birth: 1965-05-25 Gender: Female Account #: [id number] Procedure:                Upper GI endoscopy Indications:              Upper abdominal pain, Abdominal bloating                           symptoms often correlate with episodic constipation                           Clinical details in recent office consult note -                            subsequent abdominal ultrasund without gallstones                            or other cause for symptoms Medicines:                Monitored Anesthesia Care Procedure:                Pre-Anesthesia Assessment:                           - Prior to the procedure, a History and Physical                            was performed, and patient medications and                            allergies were reviewed. The patient's tolerance of                            previous anesthesia was also reviewed. The risks                            and benefits of the procedure and the sedation                            options and risks were discussed with the patient.                            All questions were answered, and informed consent                            was obtained. Prior Anticoagulants: The patient has                            taken no anticoagulant or antiplatelet agents. ASA                            Grade Assessment: II - A patient with mild systemic  disease. After reviewing the risks and benefits,                            the patient was deemed in satisfactory condition to                            undergo the procedure.                           After obtaining informed consent, the endoscope was                            passed under direct vision. Throughout the                            procedure, the patient's blood  pressure, pulse, and                            oxygen saturations were monitored continuously. The                            Olympus scope [id number] was introduced through the                            mouth, and advanced to the second part of duodenum.                            The upper GI endoscopy was accomplished without                            difficulty. The patient tolerated the procedure                            well. Scope In: Scope Out: Findings:                 The esophagus was normal.                           Diffuse mild mucosal changes were found in the                            gastric antrum. Several biopsies were obtained on                            the greater curvature of the gastric body, on the                            lesser curvature of the gastric body, on the                            greater curvature of the gastric antrum and on the                            lesser curvature  of the gastric antrum with cold                            forceps for histology.                           The cardia and gastric fundus were normal on                            retroflexion.                           Normal mucosa was found in the entire duodenum.                            Biopsies for histology were taken with a cold                            forceps for evaluation of celiac disease. Complications:            No immediate complications. Estimated Blood Loss:     Estimated blood loss: none. Estimated blood loss                            was minimal. Impression:               - Normal esophagus.                           - Mucosal changes in the antrum.                           - Normal mucosa was found in the entire examined                            duodenum. Biopsied.                           - Several biopsies were obtained on the greater                            curvature of the gastric body, on the lesser                             curvature of the gastric body, on the greater                            curvature of the gastric antrum and on the lesser                            curvature of the gastric antrum. Recommendation:           - Patient has a contact number available for                            emergencies. The signs and symptoms of potential  delayed complications were discussed with the                            patient. Return to normal activities tomorrow.                            Written discharge instructions were provided to the                            patient.                           - Resume previous diet.                           - Continue present medications.                           - Await pathology results. If normal, consider                            empiric therapy for possible SIBO. [name], MD 01/23/2023 8:25:53 AM This report has been signed electronically.

## 2023-01-23 NOTE — Patient Instructions (Addendum)

## 2023-01-24 ENCOUNTER — Telehealth: Payer: Self-pay

## 2023-01-24 NOTE — Telephone Encounter (Signed)
  Follow up Call-     01/23/2023    7:12 AM  Call back number  Post procedure Call Back phone  # [phone]  Permission to leave phone message Yes   Attempted to call patient regarding follow-up phone call. No answer, VM left.

## 2023-01-25 LAB — SURGICAL PATHOLOGY

## 2023-01-27 ENCOUNTER — Other Ambulatory Visit: Payer: Self-pay

## 2023-01-27 DIAGNOSIS — R140 Abdominal distension (gaseous): Secondary | ICD-10-CM

## 2023-01-27 DIAGNOSIS — K638219 Small intestinal bacterial overgrowth, unspecified: Secondary | ICD-10-CM

## 2023-01-27 DIAGNOSIS — R1010 Upper abdominal pain, unspecified: Secondary | ICD-10-CM

## 2023-01-27 MED ORDER — RIFAXIMIN 550 MG PO TABS
550.0000 mg | ORAL_TABLET | Freq: Three times a day (TID) | ORAL | 0 refills | Status: AC
Start: 2023-01-27 — End: 2023-02-10

## 2023-02-03 ENCOUNTER — Telehealth: Payer: Self-pay | Admitting: Pharmacy Technician

## 2023-02-03 ENCOUNTER — Other Ambulatory Visit (HOSPITAL_COMMUNITY): Payer: Self-pay

## 2023-02-03 NOTE — Telephone Encounter (Signed)
Pharmacy Patient Advocate Encounter   Received notification from CoverMyMeds that prior authorization for XIFAXAN 550MG  is required/requested.   Insurance verification completed.   The patient is insured through [facility] .   Per test claim: PA required; PA submitted to above mentioned insurance via CoverMyMeds Key/confirmation #/EOC [id number] Status is pending

## 2023-02-06 ENCOUNTER — Ambulatory Visit
Admission: RE | Admit: 2023-02-06 | Discharge: 2023-02-06 | Disposition: A | Discharge status: Home / Self Care | Payer: 59 | Source: Ambulatory Visit | Attending: Family Medicine | Admitting: Family Medicine

## 2023-02-06 ENCOUNTER — Other Ambulatory Visit: Payer: Self-pay | Admitting: Family Medicine

## 2023-02-06 DIAGNOSIS — N6310 Unspecified lump in the right breast, unspecified quadrant: Secondary | ICD-10-CM

## 2023-02-06 DIAGNOSIS — R928 Other abnormal and inconclusive findings on diagnostic imaging of breast: Secondary | ICD-10-CM

## 2023-02-06 DIAGNOSIS — N6489 Other specified disorders of breast: Secondary | ICD-10-CM

## 2023-02-10 NOTE — Telephone Encounter (Signed)
Spoke with patient & advised her that prior Berkley Harvey is still pending for the Xifaxan, and once approved/denied we would let her know.

## 2023-02-10 NOTE — Telephone Encounter (Signed)
Inbound call from patient, states Jeneen Rinks was denied. She would like to discuss follow up to discuss further options.

## 2023-02-13 ENCOUNTER — Encounter: Payer: Self-pay | Admitting: Family Medicine

## 2023-02-14 ENCOUNTER — Other Ambulatory Visit (HOSPITAL_COMMUNITY): Payer: Self-pay

## 2023-02-14 NOTE — Telephone Encounter (Signed)
Pharmacy Patient Advocate Encounter  Received notification from [facility] that Prior Authorization for XIFAXAN TAB 550MG  has been DENIED.  Full denial letter will be uploaded to the media tab. See denial reason below.  This request was denied because you did not meet the following requirements: Based on the information provided, you do not meet the established medication-specific criteria or guidelines for Xifaxan at this time. The request for coverage for XIFAXAN TAB 550MG , use as directed (42 per 14 days), is denied. This decision is based on health plan criteria for XIFAXAN TAB 550MG . This medicine is covered only if: One of the following: (1) All of the following: (A) You have a diagnosis of travelers' diarrhea. (B) You have a history of failure, contraindication, or intolerance to one of the following: (I) Azithromycin (generic Zithromax). (II) Ciprofloxacin (generic Cipro). (III) Levofloxacin (generic Levaquin). (IV) Ofloxacin (generic Floxin). (2) All of the following: [phone] All [employer] trademarks and logos are owned by [employer]. All other brand or product names are trademarks or registered marks of their respective owners. All rights reserved. Page 2 of 7 (A) You have a diagnosis of hepatic encephalopathy. (B) One of the following: (I) Both of the following: (a) Xifaxan will be used as add-on therapy to lactulose. (b) You are unable to achieve an optimal clinical response with lactulose monotherapy. (II) You have a history of contraindication or intolerance to lactulose. (3) All of the following: (A) You have a diagnosis of irritable bowel syndrome with diarrhea. (B) You have a history of failure, contraindication, or intolerance to a tricyclic antidepressant (for example, amitriptyline). (C) One of the following: (I) You have a history of failure, contraindication or intolerance to Viberzi*. (II) You have a history of or potential for a substance abuse disorder. (4)  All of the following: (A) You have a diagnosis of inflammatory bowel disease. (B) You have a history of failure, contraindication or intolerance to both of the following: (I) Ciprofloxacin (generic Cipro). (II) Metronidazole (generic Flagyl). The information provided does not show that you meet the criteria listed above.  PA #/Case ID/Reference #: [id number]

## 2023-02-17 ENCOUNTER — Other Ambulatory Visit: Payer: Self-pay

## 2023-02-17 MED ORDER — METRONIDAZOLE 250 MG PO TABS: 250.0000 mg | ORAL_TABLET | Freq: Three times a day (TID) | ORAL | 1 refills | Status: AC

## 2023-02-17 NOTE — Telephone Encounter (Signed)
Spoke with patient regarding denial & advised her of new medication that has been sent in. Pt verbalized all understanding.

## 2023-02-17 NOTE — Telephone Encounter (Signed)
Her insurance will not cover rifaximin for her symptoms of abdominal distention and bloating.  Alternate therapy would be metronidazole 250 mg 1 tablet 3 times daily for 10 days 30 tablets 1 refill   - HD

## 2023-04-11 ENCOUNTER — Other Ambulatory Visit: Payer: Self-pay | Admitting: Family Medicine

## 2023-04-11 DIAGNOSIS — M8588 Other specified disorders of bone density and structure, other site: Secondary | ICD-10-CM

## 2023-04-25 ENCOUNTER — Ambulatory Visit (INDEPENDENT_AMBULATORY_CARE_PROVIDER_SITE_OTHER): Payer: 59 | Admitting: Family Medicine

## 2023-04-25 ENCOUNTER — Encounter: Payer: Self-pay | Admitting: Family Medicine

## 2023-04-25 VITALS — BP 116/68 | HR 72 | Temp 98.0°F | Ht 62.0 in | Wt 132.8 lb

## 2023-04-25 DIAGNOSIS — Z131 Encounter for screening for diabetes mellitus: Secondary | ICD-10-CM

## 2023-04-25 DIAGNOSIS — E875 Hyperkalemia: Secondary | ICD-10-CM | POA: Diagnosis not present

## 2023-04-25 DIAGNOSIS — Z0000 Encounter for general adult medical examination without abnormal findings: Secondary | ICD-10-CM | POA: Diagnosis not present

## 2023-04-25 DIAGNOSIS — M457 Ankylosing spondylitis of lumbosacral region: Secondary | ICD-10-CM | POA: Diagnosis not present

## 2023-04-25 DIAGNOSIS — R140 Abdominal distension (gaseous): Secondary | ICD-10-CM | POA: Diagnosis not present

## 2023-04-25 LAB — BASIC METABOLIC PANEL
BUN: 11 mg/dL (ref 6–23)
CO2: 27 meq/L (ref 19–32)
Calcium: 9.3 mg/dL (ref 8.4–10.5)
Chloride: 102 meq/L (ref 96–112)
Creatinine, Ser: 0.62 mg/dL (ref 0.40–1.20)
GFR: 98.63 mL/min (ref >60.00)
Glucose, Bld: 98 mg/dL (ref 70–99)
Potassium: 5.3 meq/L — ABNORMAL HIGH (ref 3.5–5.1)
Sodium: 135 meq/L (ref 135–145)

## 2023-04-25 LAB — HEMOGLOBIN A1C: Hgb A1c MFr Bld: 5.7 % (ref 4.6–6.5)

## 2023-04-25 NOTE — Assessment & Plan Note (Addendum)
 Managing with periodic meloxicam 15 mg daily.

## 2023-04-25 NOTE — Assessment & Plan Note (Signed)
 Continue with regimen of OTCs and Prilosec to managing symptoms.

## 2023-04-25 NOTE — Assessment & Plan Note (Signed)
 I will reassess a potassium level today.

## 2023-04-25 NOTE — Progress Notes (Signed)
 [facility] [address] [place] [address] Dept: [phone] Dept Fax: [phone]  Annual Physical Visit  Subjective:    Patient ID: Emily Woodward, female    DOB: 12-02-1965, 58 y.o..   MRN: [id number]  Chief Complaint  Patient presents with   Annual Exam    CPE/labs.  Fasting today.  No concerns.    History of Present Illness:  Patient is in today for an annual physical/preventative visit.  Ms. Turrubiates has a history of ankylosing spondylitis. She does note some achiness/stiffness in her lower back at times, but for the most part this remains in remission. She did have an episode of iritis last year, which responded to steroid eye drops.  Additionally, she has a history of bilateral knee pain. She relates this to issues with rubbing of her posterior patellae. She has noted some increased pain episodically. She uses meloxicam when needed for this, usually for 7-10 days until improved.   Ms. Bollier has a history of hyperkalemia.    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 Prior EGD was normal. She has found a regimen of Tums, Pepcid, and Prilosec that typically gets this back in balance within a few days.   Review of Systems  Constitutional:  Negative for chills, diaphoresis, fever, malaise/fatigue and weight loss.  HENT:  Negative for congestion, ear pain, hearing loss, sinus pain, sore throat and tinnitus.   Eyes:  Negative for blurred vision, pain, discharge and redness.  Respiratory:  Negative for cough, shortness of breath and wheezing.   Cardiovascular:  Negative for chest pain and palpitations.       Notes some mild dyspnea when exerting on inclines. She feels this is likely due to deconditioning. She has had prior closure of an atrial septal defect. She plans to go hiking in [place] in Aug.  Gastrointestinal:  Positive for heartburn. Negative for abdominal pain, constipation, diarrhea, nausea and  vomiting.       As noted above.  Musculoskeletal:  Positive for joint pain. Negative for back pain and myalgias.       As noted above.  Skin:  Negative for itching and rash.  Psychiatric/Behavioral:  Negative for depression. The patient is not nervous/anxious.    Past Medical History: Patient Active Problem List   Diagnosis Date Noted   H/O catheter-based closure of atrial septal defect 08/18/2022   Bloating- Cyclic 08/18/2022   Palpitations 08/18/2022   Impingement of right shoulder 04/15/2022   Primary osteoarthritis of both knees 04/15/2022   History of retinal detachment- OS 04/15/2022   History of infection due to ESBL Escherichia coli 08/23/2021   Fracture of scaphoid bone of wrist 07/28/2021   Acquired trigger finger 07/28/2021   Ankylosing spondylitis (HCC) 04/12/2021   Osteopenia 04/12/2021   Lichen planus 04/12/2021   GERD (gastroesophageal reflux disease) 04/12/2021   Hyperkalemia 04/12/2021   Chronic constipation 04/12/2021   Past Surgical History:  Procedure Laterality Date   ATRIAL SEPTAL DEFECT(ASD) CLOSURE N/A 06/28/2022   Procedure: ATRIAL SEPTAL DEFECT(ASD) CLOSURE;  Surgeon: Tonny Bollman, MD;  Location: [facility] INVASIVE CV LAB;  Service: Cardiovascular;  Laterality: N/A;   TEE WITHOUT CARDIOVERSION N/A 06/02/2022   Procedure: TRANSESOPHAGEAL ECHOCARDIOGRAM (TEE);  Surgeon: Sande Rives, MD;  Location: [facility] ENDOSCOPY;  Service: Cardiovascular;  Laterality: N/A;   TOTAL ABDOMINAL HYSTERECTOMY     Ovaries not removed   Family History  Problem Relation Age of Onset   Arthritis Mother    Hyperlipidemia Mother  Osteoporosis Mother    Ankylosing spondylitis Father    Diabetes Father    Ankylosing spondylitis Sister    Ovarian cancer Sister    Ankylosing spondylitis Brother    Other Daughter        lactose and gluten intolerant   Irritable bowel syndrome Daughter    Colon cancer Neg Hx    Esophageal cancer Neg Hx    Rectal cancer Neg Hx    Stomach  cancer Neg Hx    Outpatient Medications Prior to Visit  Medication Sig Dispense Refill   albuterol (VENTOLIN HFA) 108 (90 Base) MCG/ACT inhaler Inhale 2 puffs into the lungs every 6 (six) hours as needed for wheezing or shortness of breath. 8 g 2   calcium carbonate (OS-CAL - DOSED IN MG OF ELEMENTAL CALCIUM) 1250 (500 Ca) MG tablet Take 1 tablet by mouth in the morning.     estradiol (CLIMARA - DOSED IN MG/24 HR) 0.025 mg/24hr patch Place 1 patch (0.025 mg total) onto the skin once a week. 4 patch 12   meloxicam (MOBIC) 15 MG tablet Take 15 mg by mouth daily as needed for pain.     Multiple Vitamin (MULTIVITAMIN) tablet Take 1 tablet by mouth every other day. In the morning     polyethylene glycol (MIRALAX / GLYCOLAX) 17 g packet Take 17 g by mouth daily as needed for moderate constipation.     risedronate (ACTONEL) 150 MG tablet TAKE 1 TABLET BY MOUTH ONCE EVERY MONTH 3 tablet 3   No facility-administered medications prior to visit.   No Known Allergies Objective:   Today's Vitals   04/25/23 0927  BP: 116/68  Pulse: 72  Temp: 98 F (36.7 C)  TempSrc: Temporal  SpO2: 100%  Weight: 132 lb 12.8 oz (60.2 kg)  Height: 5\' 2"  (1.575 m)   Body mass index is 24.29 kg/m.   General: Well developed, well nourished. No acute distress. HEENT: Normocephalic, non-traumatic. PERRL, EOMI. Conjunctiva clear. External ears normal. EAC and   TMs normal bilaterally. Nose clear without congestion or rhinorrhea. Mucous membranes moist. Oropharynx   clear. Good dentition. Neck: Supple. No lymphadenopathy. No thyromegaly. Lungs: Clear to auscultation bilaterally. No wheezing, rales or rhonchi. CV: RRR without murmurs or rubs. Pulses 2+ bilaterally. Abdomen: Soft, non-tender. Bowel sounds positive, normal pitch and frequency. No hepatosplenomegaly. No   rebound or guarding. Extremities: Full ROM. No joint swelling or tenderness. Mild crepitance of the left knee. No edema noted. Skin: Warm and dry. No  rashes. Psych: Alert and oriented. Normal mood and affect.  Health Maintenance Due  Topic Date Due   HIV Screening  Never done     Assessment & Plan:   Problem List Items Addressed This Visit       Musculoskeletal and Integument   Ankylosing spondylitis (HCC)   Managing with periodic meloxicam 15 mg daily.        Other   Bloating- Cyclic   Continue with regimen of OTCs and Prilosec to managing symptoms.      Hyperkalemia   I will reassess a potassium level today.      Relevant Orders   Basic metabolic panel (Completed)   Other Visit Diagnoses       Annual physical exam    -  Primary   Overall excellent health. Encourage ongoing regular exercise. Discussed recommended screenings and immunizations.     Screening for diabetes mellitus (DM)       Relevant Orders   Hemoglobin A1c (Completed)  Basic metabolic panel (Completed)       Return in about 1 year (around 04/24/2024) for Annual preventative care.   Loyola Mast, MD

## 2023-04-28 ENCOUNTER — Ambulatory Visit: Payer: 59 | Admitting: Gastroenterology

## 2023-05-08 ENCOUNTER — Other Ambulatory Visit: Payer: Self-pay

## 2023-05-08 DIAGNOSIS — R140 Abdominal distension (gaseous): Secondary | ICD-10-CM

## 2023-05-08 DIAGNOSIS — R1010 Upper abdominal pain, unspecified: Secondary | ICD-10-CM

## 2023-06-07 ENCOUNTER — Ambulatory Visit: Payer: Self-pay

## 2023-06-07 ENCOUNTER — Ambulatory Visit (INDEPENDENT_AMBULATORY_CARE_PROVIDER_SITE_OTHER): Admitting: Family Medicine

## 2023-06-07 VITALS — BP 104/73 | HR 82 | Temp 97.9°F | Ht 62.0 in | Wt 132.0 lb

## 2023-06-07 DIAGNOSIS — L0390 Cellulitis, unspecified: Secondary | ICD-10-CM

## 2023-06-07 MED ORDER — CEPHALEXIN 500 MG PO CAPS
500.0000 mg | ORAL_CAPSULE | Freq: Four times a day (QID) | ORAL | 0 refills | Status: AC
Start: 2023-06-07 — End: 2023-06-12

## 2023-06-07 NOTE — Progress Notes (Signed)
   Acute Office Visit  Subjective:     Patient ID: Emily Woodward, female    DOB: 1965-11-13, 58 y.o.   MRN: [id number]  Chief Complaint  Patient presents with   Skin Problem    Patient presents with concerns of an infected scarthat is tender to touch and itching. No drainage. Hx of laparoscopic surgery x15 years ago.Symptoms started this past Sunday. Recalls having occasional itching episodes at incision site but no other related activity to irritate area. Does not remember if clothing rubbed area to irritate it. Same flare up of incision site last year but resolved on its own. Mupirocin ointment used on site, but is not helping. Rating pain 8/10. Has discomfort when moving and changing positions. No changes in soaps or detergents. Not taken anything for pain. No other symptoms. Denies fever, chills, body aches, or malaise.    Review of Systems  Constitutional:  Negative for chills, fever, malaise/fatigue and weight loss.  Gastrointestinal:  Negative for constipation, diarrhea, nausea and vomiting.       Tenderness at incision site near umbilicus.        Objective:    BP 104/73   Pulse 82   Temp 97.9 F (36.6 C) (Oral)   Ht 5\' 2"  (1.575 m)   Wt 132 lb (59.9 kg)   SpO2 100%   BMI 24.14 kg/m    Physical Exam Constitutional:      Appearance: Normal appearance.  Cardiovascular:     Rate and Rhythm: Regular rhythm.  Pulmonary:     Effort: Pulmonary effort is normal.     Breath sounds: Normal breath sounds.  Abdominal:     General: Abdomen is flat. A surgical scar is present. Bowel sounds are normal.     Palpations: Abdomen is soft.     Tenderness: There is abdominal tenderness in the periumbilical area.       Comments: Surgical scar is red, swollen, and tender to touch. No drainage or pus noted. Beginning to have red streaking below umbilical region.  Neurological:     Mental Status: She is alert and oriented to person, place, and time.  Psychiatric:        Mood and  Affect: Mood normal.     No results found for any visits on 06/07/23.      Assessment & Plan:   Problem List Items Addressed This Visit   None Visit Diagnoses       Cellulitis, unspecified cellulitis site    -  Primary   Relevant Medications   cephALEXin (KEFLEX) 500 MG capsule       Meds ordered this encounter  Medications   cephALEXin (KEFLEX) 500 MG capsule    Sig: Take 1 capsule (500 mg total) by mouth 4 (four) times daily for 5 days.    Dispense:  20 capsule    Refill:  0    Supervising Provider:   Danise Edge A [[id number]]  Take prescribed antibiotic as directed. Use warm compresses at site to help reduce pain and swelling Monitor for increased swelling, warmth, pus or drainage at site. If you begin to develop a fever, chills, or body aches call office.  No follow-ups on file.  Dyke Brackett, Student NP

## 2023-06-07 NOTE — Telephone Encounter (Signed)
 Chief Complaint: Painful, red, inflamed keloid Symptoms: see above Frequency: 2 days Pertinent Negatives: Patient denies fever Disposition: [] ED /[] Urgent Care (no appt availability in office) / [x] Appointment(In office/virtual)/ []  [facility]/ [] Home Care/ [] Refused Recommended Disposition /[] [facility]/ []  Follow-up with PCP Additional Notes: Patient called stating she has been experiencing painful, red, and inflamed keloid near her belly button. 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 She states she notices some pus trying to form as well, but currently there is no open area. Appt made for today for further evaluation.    Copied from CRM [id number]. Topic: Clinical - Red Word Triage >> Jun 07, 2023  8:30 AM [place] E wrote: [facility] that prompted transfer to Nurse Triage: Patient has a keloid in her navel area that is infected and painful. Patient stated this has been going on for the past 2 days, where it is inflamed, swollen, red, and painful. Patient rated the pain a level 8 out of 10. Reason for Disposition  [1] Small swelling or lump AND [2] unexplained AND [3] present > 1 week  Answer Assessment - Initial Assessment Questions 1. APPEARANCE of SWELLING: "What does it look like?"     Swollen, hard, and red 2. SIZE: "How large is the swelling?" (e.g., inches, cm; or compare to size of pinhead, tip of pen, eraser, coin, pea, grape, ping pong ball)      Inch long  3. LOCATION: "Where is the swelling located?"     Right near belly button and extends horizontally to the left 4. ONSET: "When did the swelling start?"     Two days ago 5. COLOR: "What color is it?" "Is there more than one color?"     Redness  6. PAIN: "Is there any pain?" If Yes, ask: "How bad is the pain?" (e.g., scale 1-10; or mild, moderate, severe)     - NONE (0): no pain   - MILD (1-3):  doesn't interfere with normal activities    - MODERATE (4-7): interferes with normal activities or awakens from sleep    - SEVERE (8-10): excruciating pain, unable to do any normal activities     8 7. ITCH: "Does it itch?" If Yes, ask: "How bad is the itch?"      No 8. CAUSE: "What do you think caused the swelling?"       Laparoscopic hysterectomy a few years ago caused a keloid  9 OTHER SYMPTOMS: "Do you have any other symptoms?" (e.g., fever)     No  Protocols used: Skin Lump or Localized Swelling-A-AH

## 2023-06-07 NOTE — Telephone Encounter (Signed)
 Noted. Pt is seeing Clayborne Dana, NP) today at 1:40pm

## 2023-06-07 NOTE — Progress Notes (Signed)
   Acute Office Visit  Subjective:     Patient ID: Emily Woodward, female    DOB: 05-May-1965, 58 y.o.   MRN: [id number]  Chief Complaint  Patient presents with   Skin Problem    HPI Patient is in today for rash.   Discussed the use of AI scribe software for clinical note transcription with the patient, who gave verbal consent to proceed.  History of Present Illness Emily Woodward is a 58 year old female who presents with a painful, possibly infected scar.  Approximately three to four days ago, she noticed a painful scar described as 'very pinchy' with occasional radiation. The area is red and tender, causing significant discomfort, but there is no itching or drainage. She has been applying mupirocin ointment without improvement.  She recalls a similar issue last year that resolved without medical intervention. She denies any recent trauma or unusual activity that could have exacerbated the condition. No fever or systemic symptoms are present.  She is not allergic to any antibiotics and has not taken any recent antibiotics. Her current medications were reviewed.         ROS All review of systems negative except what is listed in the HPI      Objective:    BP 104/73   Pulse 82   Temp 97.9 F (36.6 C) (Oral)   Ht 5\' 2"  (1.575 m)   Wt 132 lb (59.9 kg)   SpO2 100%   BMI 24.14 kg/m    Physical Exam Vitals reviewed.  Constitutional:      Appearance: Normal appearance.  Skin:    Findings: Erythema present.     Comments: Erythematous area is warm, tender to touch  Neurological:     Mental Status: She is alert and oriented to person, place, and time.  Psychiatric:        Mood and Affect: Mood normal.        Behavior: Behavior normal.        Thought Content: Thought content normal.        Judgment: Judgment normal.         No results found for any visits on 06/07/23.      Assessment & Plan:   Problem List Items Addressed This Visit   None Visit Diagnoses        Cellulitis, unspecified cellulitis site    -  Primary   Relevant Medications   cephALEXin (KEFLEX) 500 MG capsule      Assessment & Plan  Umbilical region firm, red, tender. No drainage or pruritus.  - Prescribed Keflex  - Advised warm compresses multiple times daily. - Instructed on hygiene, frequent clothing changes - Advised follow-up if no improvement.    Meds ordered this encounter  Medications   cephALEXin (KEFLEX) 500 MG capsule    Sig: Take 1 capsule (500 mg total) by mouth 4 (four) times daily for 5 days.    Dispense:  20 capsule    Refill:  0    Supervising Provider:   Danise Edge A [[id number]]    Return if symptoms worsen or fail to improve.  Clayborne Dana, NP

## 2023-06-30 ENCOUNTER — Other Ambulatory Visit: Payer: Self-pay | Admitting: Physician Assistant

## 2023-06-30 ENCOUNTER — Ambulatory Visit (INDEPENDENT_AMBULATORY_CARE_PROVIDER_SITE_OTHER): Admitting: Family Medicine

## 2023-06-30 DIAGNOSIS — Z8774 Personal history of (corrected) congenital malformations of heart and circulatory system: Secondary | ICD-10-CM

## 2023-06-30 DIAGNOSIS — M457 Ankylosing spondylitis of lumbosacral region: Secondary | ICD-10-CM

## 2023-06-30 MED ORDER — MUPIROCIN 2 % EX OINT: 1.0000 | TOPICAL_OINTMENT | Freq: Two times a day (BID) | CUTANEOUS | 0 refills | Status: AC

## 2023-06-30 MED ORDER — DOXYCYCLINE HYCLATE 100 MG PO TABS
100.0000 mg | ORAL_TABLET | Freq: Two times a day (BID) | ORAL | 0 refills | Status: AC
Start: 2023-06-30 — End: 2023-07-07

## 2023-06-30 MED ORDER — METRONIDAZOLE 500 MG PO TABS
500.0000 mg | ORAL_TABLET | Freq: Two times a day (BID) | ORAL | 0 refills | Status: AC
Start: 2023-06-30 — End: 2023-07-07

## 2023-06-30 NOTE — Progress Notes (Signed)
 Assessment/Plan:   Assessment & Plan Omphalitis Recurrent omphalitis with oozing and pain around the umbilicus. Previous cefalexin treatment was insufficient. Differential includes possible structural abnormality from previous hysterectomy scar leading to recurrent infection. No systemic infection signs such as nausea or vomiting. No abscess on examination, but CT scan may be considered if infection persists. Broad-spectrum antibiotics to cover MRSA and potential surgical evaluation if structural issues are identified were discussed. - Prescribe doxycycline 100 mg twice daily for 7 days. - Prescribe metronidazole  500 mg twice daily for 7 days. - Apply mupirocin topically twice daily. - Advise to keep the area clean and moist, avoid alcohol or iodine. - Follow up in one week to assess response to treatment. - Consider CT scan if infection persists or recurs. - Refer to a specialist, possibly a Engineer, petroleum, if structural abnormality is suspected after infection resolution.  Ankylosing spondylitis Ankylosing spondylitis, currently manageable. Slightly increased risk of infection severity due to potential immunocompromise.      There are no discontinued medications.  Return in about 1 week (around 07/07/2023).    Subjective:   Encounter date: 06/30/2023  Emily Woodward is a 58 y.o. female who has Ankylosing spondylitis (HCC); Osteopenia; Lichen planus; GERD (gastroesophageal reflux disease); Hyperkalemia; Chronic constipation; Fracture of scaphoid bone of wrist; Acquired trigger finger; History of infection due to ESBL Escherichia coli; Impingement of right shoulder; Primary osteoarthritis of both knees; History of retinal detachment- OS; H/O catheter-based closure of atrial septal defect; Bloating- Cyclic; and Palpitations on their problem list..   She  has a past medical history of Ankylosing spondylitis (HCC), Atrial fibrillation (HCC), and Osteopenia..   She presents with chief  complaint of Keloid (Keloid In belly button present for 15 years, but now looks infected. ) .   Discussed the use of AI scribe software for clinical note transcription with the patient, who gave verbal consent to proceed.  History of Present Illness Emily Woodward is a 58 year old female who presents with recurrent umbilical infection.  She has been experiencing irritation and oozing from her umbilicus, which has been recurring. Initially, she was prescribed Cefalexin, taken four times a day for five days starting June 07, 2023. Although symptoms initially improved, they have since returned with increased oozing and pain.  The discharge is described as foul-smelling, and the area is painful and itchy. The infection has persisted for about a month. She mentions a similar episode last year that resolved spontaneously after the area burst and was cleaned. No nausea or vomiting. She has not had any imaging studies specifically for this issue, although she had an abdominal ultrasound last year for gastrointestinal issues, which did not reveal any findings related to her current problem.  Her current medications for this issue include mupirocin ointment, which she had used previously but stopped. She has no known allergies to antibiotics.  Her past medical history includes a hysterectomy performed 15 years ago and ankylosing spondylitis, which she states is manageable.       Past Surgical History:  Procedure Laterality Date   ATRIAL SEPTAL DEFECT(ASD) CLOSURE N/A 06/28/2022   Procedure: ATRIAL SEPTAL DEFECT(ASD) CLOSURE;  Surgeon: Arnoldo Lapping, MD;  Location: [facility] INVASIVE CV LAB;  Service: Cardiovascular;  Laterality: N/A;   TEE WITHOUT CARDIOVERSION N/A 06/02/2022   Procedure: TRANSESOPHAGEAL ECHOCARDIOGRAM (TEE);  Surgeon: Harrold Lincoln, MD;  Location: [facility] ENDOSCOPY;  Service: Cardiovascular;  Laterality: N/A;   TOTAL ABDOMINAL HYSTERECTOMY     Ovaries not removed    Outpatient  Medications Prior to Visit  Medication Sig Dispense Refill   calcium carbonate (OS-CAL - DOSED IN MG OF ELEMENTAL CALCIUM) 1250 (500 Ca) MG tablet Take 1 tablet by mouth in the morning.     estradiol  (CLIMARA  - DOSED IN MG/24 HR) 0.025 mg/24hr patch Place 1 patch (0.025 mg total) onto the skin once a week. 4 patch 12   meloxicam (MOBIC) 15 MG tablet Take 15 mg by mouth daily as needed for pain.     Multiple Vitamin (MULTIVITAMIN) tablet Take 1 tablet by mouth every other day. In the morning     polyethylene glycol (MIRALAX / GLYCOLAX) 17 g packet Take 17 g by mouth daily as needed for moderate constipation.     risedronate  (ACTONEL ) 150 MG tablet TAKE 1 TABLET BY MOUTH ONCE EVERY MONTH 3 tablet 3   No facility-administered medications prior to visit.    Family History  Problem Relation Age of Onset   Arthritis Mother    Hyperlipidemia Mother    Osteoporosis Mother    Ankylosing spondylitis Father    Diabetes Father    Ankylosing spondylitis Sister    Ovarian cancer Sister    Ankylosing spondylitis Brother    Other Daughter        lactose and gluten intolerant   Irritable bowel syndrome Daughter    Colon cancer Neg Hx    Esophageal cancer Neg Hx    Rectal cancer Neg Hx    Stomach cancer Neg Hx     Social History   Socioeconomic History   Marital status: Married    Spouse name: Not on file   Number of children: 1   Years of education: Not on file   Highest education level: Master's degree (e.g., MA, MS, MEng, MEd, MSW, MBA)  Occupational History   Occupation: Retired- Surveyor, minerals  Tobacco Use   Smoking status: Never   Smokeless tobacco: Never  Vaping Use   Vaping status: Never Used  Substance and Sexual Activity   Alcohol use: Yes    Comment: socially   Drug use: Never   Sexual activity: Yes    Partners: Male    Birth control/protection: Surgical  Other Topics Concern   Not on file  Social History Narrative   Lives with husband   Social Drivers of Health    Financial Resource Strain: Low Risk  (06/07/2023)   Overall Financial Resource Strain (CARDIA)    Difficulty of Paying Living Expenses: Not hard at all  Food Insecurity: No Food Insecurity (06/07/2023)   Hunger Vital Sign    Worried About Running Out of Food in the Last Year: Never true    Ran Out of Food in the Last Year: Never true  Transportation Needs: No Transportation Needs (06/07/2023)   PRAPARE - Administrator, Civil Service (Medical): No    Lack of Transportation (Non-Medical): No  Physical Activity: Sufficiently Active (06/07/2023)   Exercise Vital Sign    Days of Exercise per Week: 7 days    Minutes of Exercise per Session: 60 min  Stress: No Stress Concern Present (06/07/2023)   [employer] of Occupational Health - Occupational Stress Questionnaire    Feeling of Stress : Not at all  Social Connections: Moderately Integrated (06/07/2023)   Social Connection and Isolation Panel [NHANES]    Frequency of Communication with Friends and Family: Three times a week    Frequency of Social Gatherings with Friends and Family: Once a week    Attends Religious Services:  1 to 4 times per year    Active Member of Clubs or Organizations: No    Attends Banker Meetings: Not on file    Marital Status: Married  Intimate Partner Violence: Not At Risk (10/21/2021)   Received from [facility]   [facility]    Threatened: Not on file    Insulted: Not on file    Physically Hurt : Not on file    Scream: Not on file                                                                                                  Objective:  Physical Exam: BP 112/78   Pulse 60   Temp 97.8 F (36.6 C) (Temporal)   Wt 133 lb 6.4 oz (60.5 kg)   SpO2 98%   BMI 24.40 kg/m    Physical Exam GENERAL: Alert, cooperative, well developed, no acute distress. HEENT: Normocephalic, normal oropharynx, moist mucous membranes. CHEST: Clear to auscultation bilaterally, no wheezes, rhonchi, or  crackles. CARDIOVASCULAR: Normal heart rate and rhythm, S1 and S2 normal without murmurs. ABDOMEN: Soft, non-tender, non-distended, without organomegaly, normal bowel sounds. Redness and inflammation around the umbilicus.  Surgical scar from remote hysterectomy around umbilicus with some tenderness.  No purulent drainage EXTREMITIES: No cyanosis or edema. NEUROLOGICAL: Cranial nerves grossly intact, moves all extremities without gross motor or sensory deficit.   No results found.  Recent Results (from the past 2160 hours)  Hemoglobin A1c     Status: None   Collection Time: 04/25/23 10:15 AM  Result Value Ref Range   Hgb A1c MFr Bld 5.7 4.6 - 6.5 %    Comment: Glycemic Control Guidelines for People with Diabetes:Non Diabetic:  <6%Goal of Therapy: <7%Additional Action Suggested:  >8%   Basic metabolic panel     Status: Abnormal   Collection Time: 04/25/23 10:15 AM  Result Value Ref Range   Sodium 135 135 - 145 mEq/L   Potassium 5.3 No hemolysis seen (H) 3.5 - 5.1 mEq/L   Chloride 102 96 - 112 mEq/L   CO2 27 19 - 32 mEq/L   Glucose, Bld 98 70 - 99 mg/dL   BUN 11 6 - 23 mg/dL   Creatinine, Ser 1.61 0.40 - 1.20 mg/dL   GFR 09.60 >45.40 mL/min    Comment: Calculated using the CKD-EPI Creatinine Equation (2021)   Calcium 9.3 8.4 - 10.5 mg/dL        Carnell Christian, MD, MS

## 2023-06-30 NOTE — Progress Notes (Signed)
 [facility]   MULTIDISCIPLINARY HEART VALVE CLINIC                                     Cardiology Office Note:    Date:  07/03/2023   ID:  Emily Woodward, DOB 26-Jun-1965, MRN [id number]  PCP:  Graig Lawyer, MD  [facility] Cardiologist:  None  [facility] Structural heart: Arnoldo Lapping, MD [facility] Electrophysiologist:  None   Referring MD: Graig Lawyer, MD   1 year s/p ASD closure.   History of Present Illness:    Emily Woodward is a 58 y.o. female with a hx of ankylosing spondylitis, osteopenia, and atrial septal defect with right sided heart dilation s/p ASD closure (06/28/22) who presents to clinic for follow up.   Ms. Ericson was initially seen by cardiology due to worsening SOB. She underwent an exercise treadmill study which was found to be abnormal. Subsequent gated coronary CTA showed normal coronary arteries, however did show the presence of a secundum atrial septal defect along with dilatation of the right heart chambers. TEE confirmed ASD. S/p ASD closure with a 12 mm Amplatzer atrial septal occluder by Dr. Arlester Ladd. Treated with 6 months of DAPT. She developed palpitations after closure but follow up Zio monitor was unremarkable. She continued to have SOB and repeat echo with bubble study 08/04/22 showed EF 55-60%, normal RV, mild MR and negative bubble study.   Today the patient presents to clinic for follow up. Here alone. No CP. Only get SOB when walking up inclines. Setting out to go on a big hiking trip in [place] soon.  No LE edema, orthopnea or PND. No dizziness or syncope. No blood in stool or urine. No palpitations.    Past Medical History:  Diagnosis Date   Ankylosing spondylitis (HCC)    Atrial fibrillation (HCC)    Osteopenia      Current Medications: Current Meds  Medication Sig   calcium carbonate (OS-CAL - DOSED IN MG OF ELEMENTAL CALCIUM) 1250 (500 Ca) MG tablet Take 1 tablet by mouth in the morning.   doxycycline (VIBRA-TABS)  100 MG tablet Take 1 tablet (100 mg total) by mouth 2 (two) times daily for 7 days.   estradiol  (CLIMARA  - DOSED IN MG/24 HR) 0.025 mg/24hr patch Place 1 patch (0.025 mg total) onto the skin once a week.   meloxicam (MOBIC) 15 MG tablet Take 15 mg by mouth daily as needed for pain.   metroNIDAZOLE  (FLAGYL ) 500 MG tablet Take 1 tablet (500 mg total) by mouth 2 (two) times daily for 7 days.   Multiple Vitamin (MULTIVITAMIN) tablet Take 1 tablet by mouth every other day. In the morning   mupirocin ointment (BACTROBAN) 2 % Apply 1 Application topically 2 (two) times daily.   polyethylene glycol (MIRALAX / GLYCOLAX) 17 g packet Take 17 g by mouth daily as needed for moderate constipation.   risedronate  (ACTONEL ) 150 MG tablet TAKE 1 TABLET BY MOUTH ONCE EVERY MONTH      ROS:   Please see the history of present illness.    All other systems reviewed and are negative.  EKGs       Risk Assessment/Calculations:            Physical Exam:    VS:  BP 110/70   Pulse (!) 50   Ht 5\' 2"  (1.575 m)   Wt 134 lb 9.6  oz (61.1 kg)   SpO2 100%   BMI 24.62 kg/m     Wt Readings from Last 3 Encounters:  07/03/23 134 lb 9.6 oz (61.1 kg)  06/30/23 133 lb 6.4 oz (60.5 kg)  06/07/23 132 lb (59.9 kg)     GEN: Well nourished, well developed in no acute distress NECK: No JVD CARDIAC: RRR, no murmurs, rubs, gallops RESPIRATORY:  Clear to auscultation without rales, wheezing or rhonchi  ABDOMEN: Soft, non-tender, non-distended EXTREMITIES:  No edema; No deformity.    ASSESSMENT:    1. H/O catheter-based closure of atrial septal defect   2. Mitral valve insufficiency, unspecified etiology     PLAN:    In order of problems listed above:  S/p ASD closure: -- S/p ASD closure with negative bubble study in 07/2022.  -- Echo today with normal LV/RV function/pulmonary pressures and no atrial level shunting.  -- Doing well with an improvement in SOB (previously noticed while on a hiking trip in [place].  [place]) and has an up coming hiking trip in [place].  -- Discussed coming back to us  on an as need basis but would feel better with follow up: Dr. Arlester Ladd in 1 year.  Mitral regurgitation:  -- Mild on echo today. No murmur on exam.  -- Given anxiety surrounding this, plan echo in 1 year and if stable, PRN.   Medication Adjustments/Labs and Tests Ordered: Current medicines are reviewed at length with the patient today.  Concerns regarding medicines are outlined above.  Orders Placed This Encounter  Procedures   ECHOCARDIOGRAM COMPLETE   No orders of the defined types were placed in this encounter.   Patient Instructions  Medication Instructions:  Your physician recommends that you continue on your current medications as directed. Please refer to the Current Medication list given to you today.  *If you need a refill on your cardiac medications before your next appointment, please call your pharmacy*  Lab Work: NONE If you have labs (blood work) drawn today and your tests are completely normal, you will receive your results only by: MyChart Message (if you have MyChart) OR A paper copy in the mail If you have any lab test that is abnormal or we need to change your treatment, we will call you to review the results.  Testing/Procedures: Echo prior to one year follow up  Follow-Up: At [facility], you and your health needs are our priority.  As part of our continuing mission to provide you with exceptional heart care, our providers are all part of one team.  This team includes your primary Cardiologist (physician) and Advanced Practice Providers or APPs (Physician Assistants and Nurse Practitioners) who all work together to provide you with the care you need, when you need it.  Your next appointment:   1 year(s)  Provider:   Arlester Ladd, MD   We recommend signing up for the patient portal called "MyChart".  Sign up information is provided on this After Visit Summary.   MyChart is used to connect with patients for Virtual Visits (Telemedicine).  Patients are able to view lab/test results, encounter notes, upcoming appointments, etc.  Non-urgent messages can be sent to your provider as well.   To learn more about what you can do with MyChart, go to [email or web address].   Other Instructions       Signed, Abagail Hoar, PA-C  07/03/2023 3:51 PM    [facility]

## 2023-06-30 NOTE — Addendum Note (Signed)
 Addended by: Ardia Kraft on: 06/30/2023 01:50 PM   Modules accepted: Orders

## 2023-06-30 NOTE — Addendum Note (Signed)
 Addended by: Ardia Kraft on: 06/30/2023 02:21 PM   Modules accepted: Orders

## 2023-07-03 ENCOUNTER — Ambulatory Visit (HOSPITAL_COMMUNITY): Payer: 59

## 2023-07-03 ENCOUNTER — Ambulatory Visit (HOSPITAL_COMMUNITY): Attending: Cardiovascular Disease

## 2023-07-03 ENCOUNTER — Encounter (HOSPITAL_COMMUNITY): Payer: Self-pay

## 2023-07-03 ENCOUNTER — Ambulatory Visit: Payer: 59 | Attending: Physician Assistant | Admitting: Physician Assistant

## 2023-07-03 ENCOUNTER — Encounter: Payer: Self-pay | Admitting: Physician Assistant

## 2023-07-03 VITALS — BP 110/70 | HR 50 | Ht 62.0 in | Wt 134.6 lb

## 2023-07-03 DIAGNOSIS — Z8774 Personal history of (corrected) congenital malformations of heart and circulatory system: Secondary | ICD-10-CM | POA: Diagnosis present

## 2023-07-03 DIAGNOSIS — I340 Nonrheumatic mitral (valve) insufficiency: Secondary | ICD-10-CM | POA: Diagnosis not present

## 2023-07-03 LAB — ECHOCARDIOGRAM COMPLETE
Area-P 1/2: 3.46 cm2
MV M vel: 5.32 m/s
MV Peak grad: 113.2 mmHg
Radius: 0.4 cm
S' Lateral: 2 cm

## 2023-07-03 NOTE — Patient Instructions (Signed)
 Medication Instructions:  Your physician recommends that you continue on your current medications as directed. Please refer to the Current Medication list given to you today.  *If you need a refill on your cardiac medications before your next appointment, please call your pharmacy*  Lab Work: NONE If you have labs (blood work) drawn today and your tests are completely normal, you will receive your results only by: MyChart Message (if you have MyChart) OR A paper copy in the mail If you have any lab test that is abnormal or we need to change your treatment, we will call you to review the results.  Testing/Procedures: Echo prior to one year follow up  Follow-Up: At [facility], you and your health needs are our priority.  As part of our continuing mission to provide you with exceptional heart care, our providers are all part of one team.  This team includes your primary Cardiologist (physician) and Advanced Practice Providers or APPs (Physician Assistants and Nurse Practitioners) who all work together to provide you with the care you need, when you need it.  Your next appointment:   1 year(s)  Provider:   Arlester Ladd, MD   We recommend signing up for the patient portal called "MyChart".  Sign up information is provided on this After Visit Summary.  MyChart is used to connect with patients for Virtual Visits (Telemedicine).  Patients are able to view lab/test results, encounter notes, upcoming appointments, etc.  Non-urgent messages can be sent to your provider as well.   To learn more about what you can do with MyChart, go to [email or web address].   Other Instructions

## 2023-07-06 ENCOUNTER — Ambulatory Visit: Admitting: Family Medicine

## 2023-07-12 ENCOUNTER — Encounter (HOSPITAL_COMMUNITY): Payer: Self-pay

## 2023-07-19 ENCOUNTER — Ambulatory Visit: Admitting: Family Medicine

## 2023-08-18 ENCOUNTER — Ambulatory Visit
Admission: RE | Admit: 2023-08-18 | Discharge: 2023-08-18 | Disposition: A | Discharge status: Home / Self Care | Source: Ambulatory Visit | Attending: Family Medicine | Admitting: Family Medicine

## 2023-08-18 ENCOUNTER — Ambulatory Visit

## 2023-08-18 ENCOUNTER — Other Ambulatory Visit: Payer: Self-pay | Admitting: Family Medicine

## 2023-08-18 DIAGNOSIS — N6310 Unspecified lump in the right breast, unspecified quadrant: Secondary | ICD-10-CM

## 2023-08-18 DIAGNOSIS — N6489 Other specified disorders of breast: Secondary | ICD-10-CM

## 2023-10-10 IMAGING — MG MM DIGITAL SCREENING BILAT W/ TOMO AND CAD
8 series · 9 of 24 positions shown · non-contrast
Comparison: Previous exam(s).

CLINICAL DATA: Screening.

EXAM:
DIGITAL SCREENING BILATERAL MAMMOGRAM WITH TOMOSYNTHESIS AND CAD
TECHNIQUE: Bilateral screening digital craniocaudal and mediolateral oblique
mammograms were obtained. Bilateral screening digital breast
tomosynthesis was performed. The images were evaluated with
computer-aided detection.

[L MLO synth-2D]
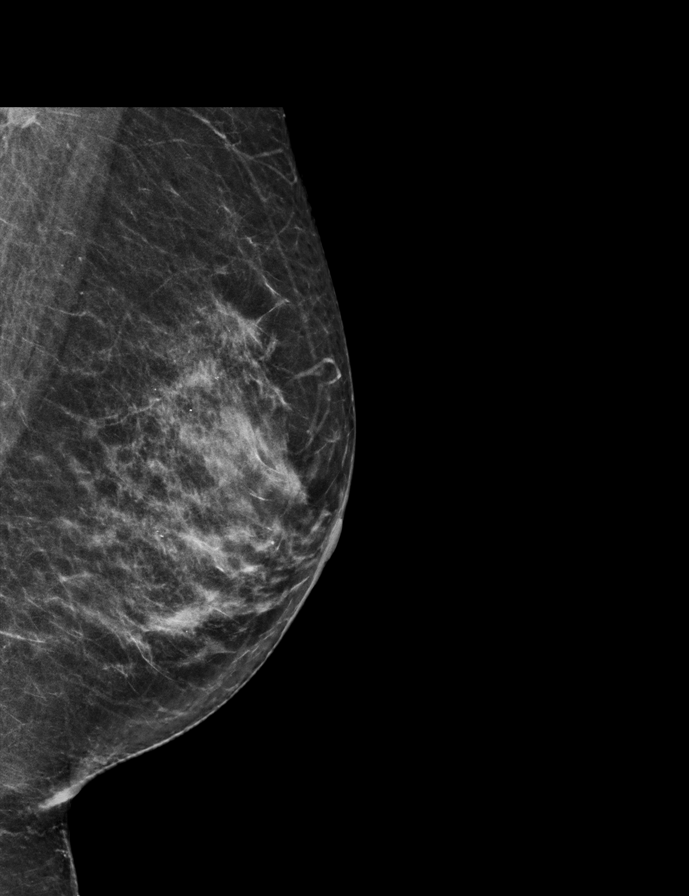

[R MLO synth-2D]
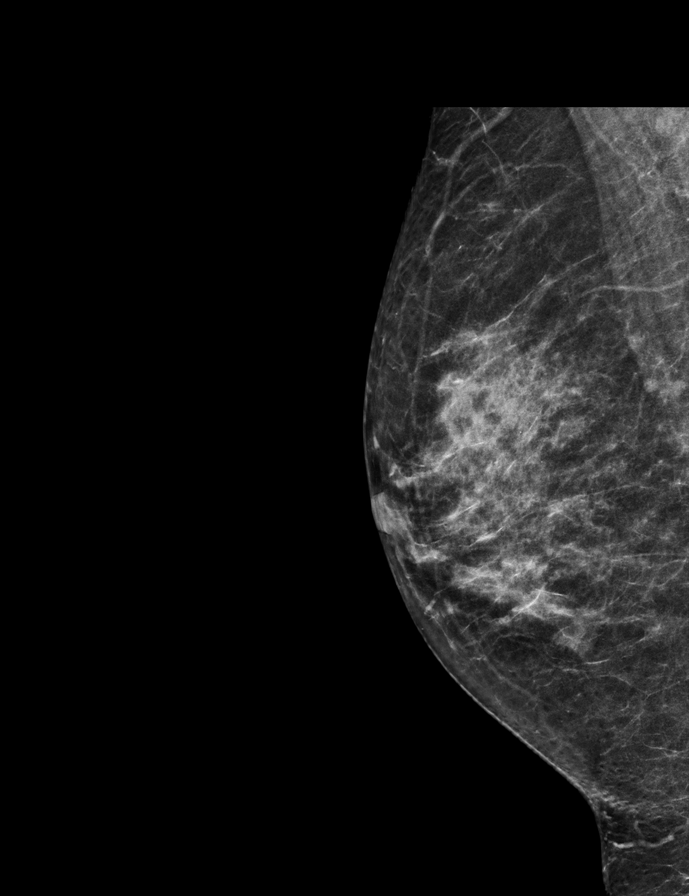

[R CC synth-2D]
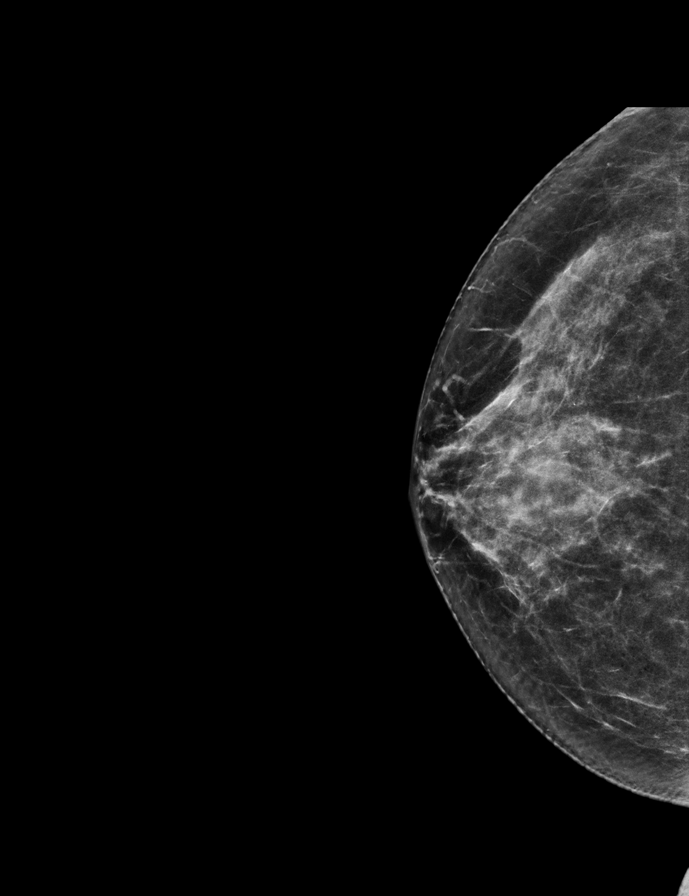

[L CC synth-2D]
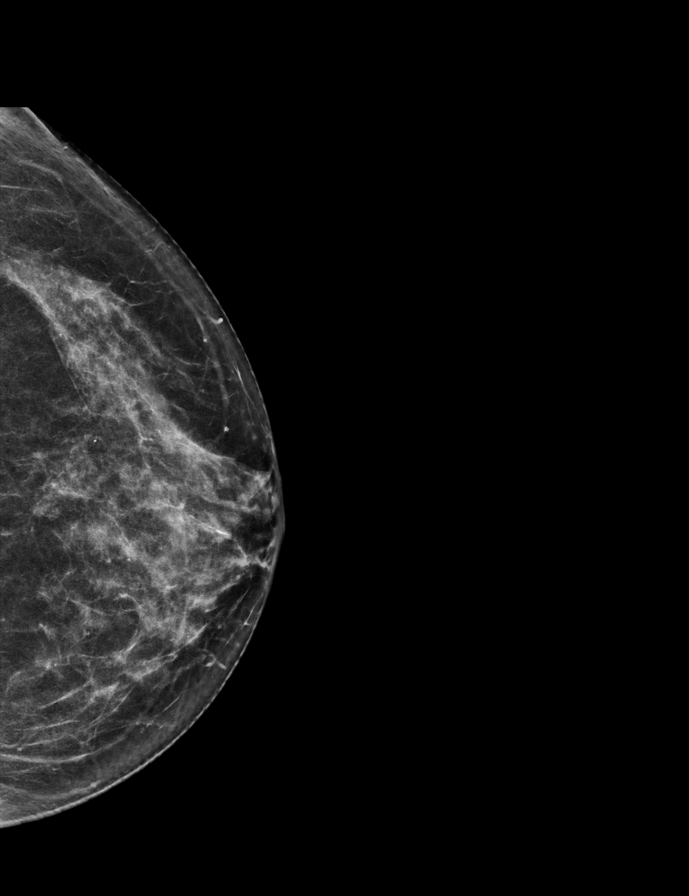

[L CC tomo · 2 of 63 frames shown]
[frame 21/63]
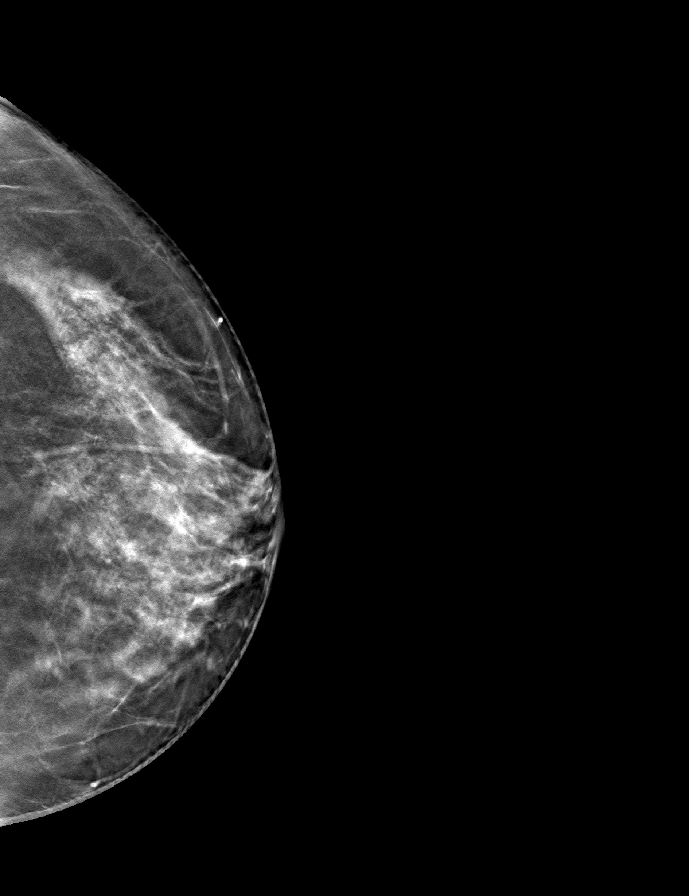
[frame 32/63]
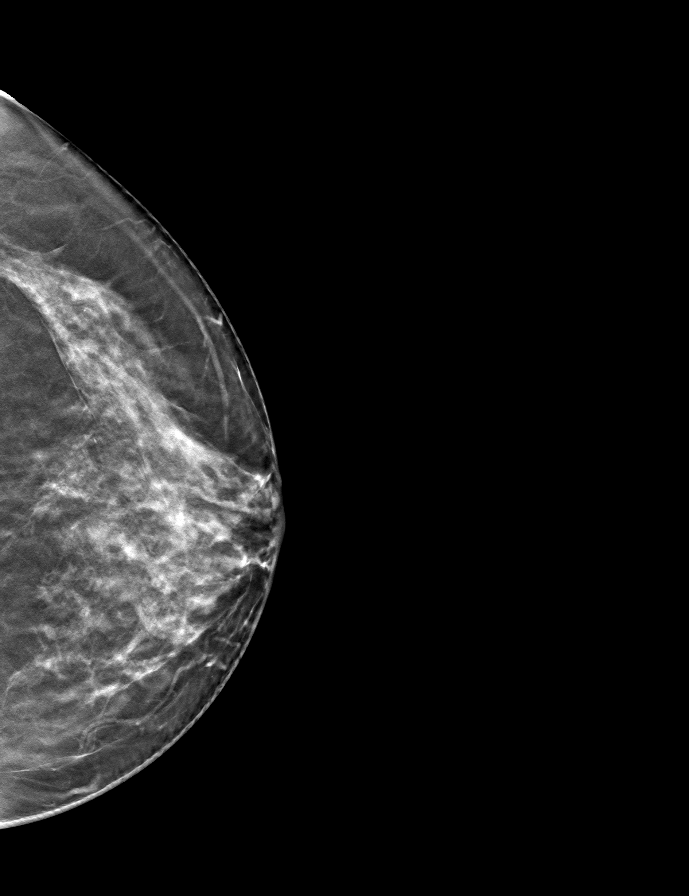

[L MLO tomo · tomo slice 31/61.0]
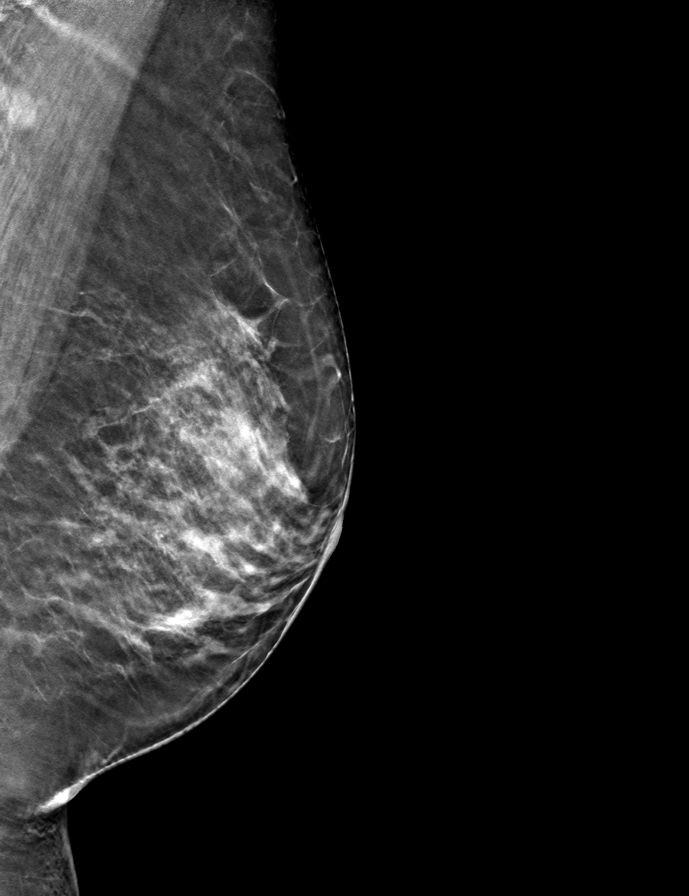

[R CC tomo · tomo slice 33/64.0]
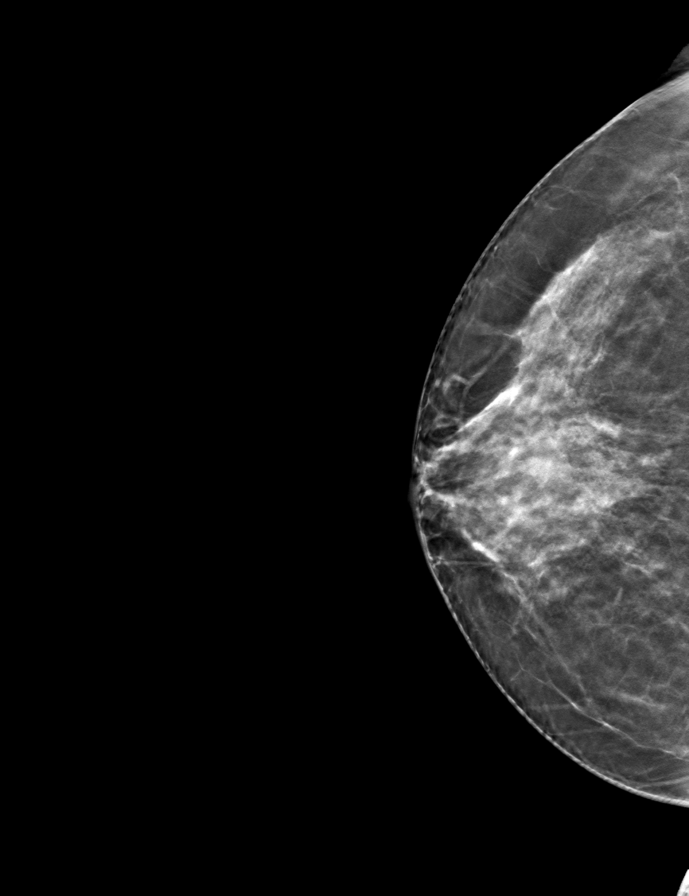

[R MLO tomo · tomo slice 31/62.0]
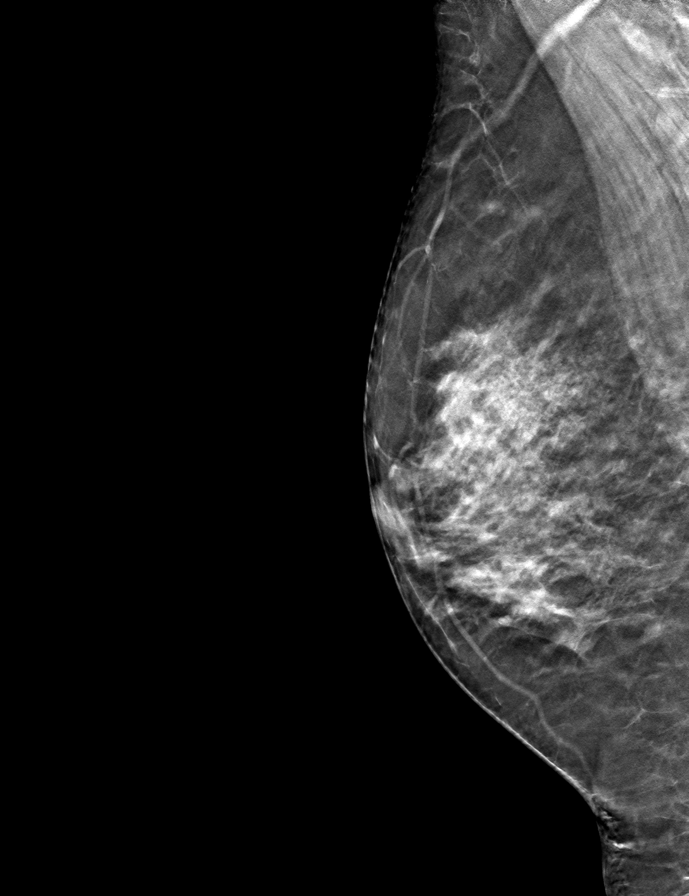

[9 of 24 positions shown; findings below may reference images not displayed]

ACR Breast Density Category c: The breast tissue is heterogeneously
dense, which may obscure small masses.
FINDINGS: There are no findings suspicious for malignancy.
IMPRESSION: No mammographic evidence of malignancy. A result letter of this
screening mammogram will be mailed directly to the patient.

RECOMMENDATION:
Screening mammogram in one year. (Code:Q3-W-BC3)

BI-RADS CATEGORY  1: Negative.

## 2023-11-24 ENCOUNTER — Telehealth: Payer: Self-pay

## 2023-11-24 NOTE — Telephone Encounter (Signed)
Colon recall placed.

## 2023-11-24 NOTE — Telephone Encounter (Signed)
-----   Message from Victory LITTIE Brand III sent at 11/23/2023  4:20 PM EDT ----- Regarding: Colonoscopy recall Emily Woodward,  She is a patient I saw in the office last year, and I just received some records from her previous GI physician in [place]  including her colonoscopy report from December 2017.  Please set a colonoscopy recall for this patient with me in December 2027.  Thank you  - HD

## 2024-03-06 ENCOUNTER — Other Ambulatory Visit: Payer: Self-pay | Admitting: Family Medicine

## 2024-03-06 DIAGNOSIS — M8580 Other specified disorders of bone density and structure, unspecified site: Secondary | ICD-10-CM

## 2024-03-06 NOTE — Telephone Encounter (Signed)
 Pt called to request refill but I informed her it was already pending. She states she would like to be able to get it today if possible.

## 2024-04-25 ENCOUNTER — Encounter: Payer: 59 | Admitting: Family Medicine

## 2024-05-13 ENCOUNTER — Ambulatory Visit (HOSPITAL_COMMUNITY)

## 2024-05-13 ENCOUNTER — Ambulatory Visit: Admitting: Cardiovascular Disease

## 2024-07-02 ENCOUNTER — Other Ambulatory Visit (HOSPITAL_COMMUNITY)
